# Patient Record
Sex: Female | Born: 1965 | Race: White | Hispanic: No | Marital: Single | Smoking: Current every day smoker
Health system: Southern US, Community
[De-identification: ages and names within clinical notes are randomized; demographics above are authoritative.]

## PROBLEM LIST (undated history)

## (undated) DIAGNOSIS — G459 Transient cerebral ischemic attack, unspecified: Secondary | ICD-10-CM

## (undated) DIAGNOSIS — F32A Depression, unspecified: Secondary | ICD-10-CM

## (undated) DIAGNOSIS — E079 Disorder of thyroid, unspecified: Secondary | ICD-10-CM

## (undated) DIAGNOSIS — H409 Unspecified glaucoma: Secondary | ICD-10-CM

## (undated) DIAGNOSIS — R87619 Unspecified abnormal cytological findings in specimens from cervix uteri: Secondary | ICD-10-CM

## (undated) DIAGNOSIS — F17200 Nicotine dependence, unspecified, uncomplicated: Principal | ICD-10-CM

## (undated) DIAGNOSIS — IMO0002 Reserved for concepts with insufficient information to code with codable children: Secondary | ICD-10-CM

## (undated) DIAGNOSIS — T7840XA Allergy, unspecified, initial encounter: Secondary | ICD-10-CM

## (undated) DIAGNOSIS — Z8711 Personal history of peptic ulcer disease: Secondary | ICD-10-CM

## (undated) DIAGNOSIS — M25531 Pain in right wrist: Secondary | ICD-10-CM

## (undated) DIAGNOSIS — G4730 Sleep apnea, unspecified: Secondary | ICD-10-CM

## (undated) DIAGNOSIS — Z8719 Personal history of other diseases of the digestive system: Secondary | ICD-10-CM

## (undated) DIAGNOSIS — F419 Anxiety disorder, unspecified: Secondary | ICD-10-CM

## (undated) DIAGNOSIS — G8929 Other chronic pain: Secondary | ICD-10-CM

## (undated) DIAGNOSIS — E785 Hyperlipidemia, unspecified: Secondary | ICD-10-CM

## (undated) DIAGNOSIS — F329 Major depressive disorder, single episode, unspecified: Secondary | ICD-10-CM

## (undated) DIAGNOSIS — R569 Unspecified convulsions: Secondary | ICD-10-CM

## (undated) HISTORY — PX: OOPHORECTOMY: SHX86

## (undated) HISTORY — PX: UTERINE FIBROID SURGERY: SHX826

## (undated) HISTORY — PX: OTHER SURGICAL HISTORY: SHX169

## (undated) HISTORY — DX: Unspecified glaucoma: H40.9

## (undated) HISTORY — DX: Transient cerebral ischemic attack, unspecified: G45.9

## (undated) HISTORY — DX: Reserved for concepts with insufficient information to code with codable children: IMO0002

## (undated) HISTORY — DX: Hyperlipidemia, unspecified: E78.5

## (undated) HISTORY — DX: Nicotine dependence, unspecified, uncomplicated: F17.200

## (undated) HISTORY — DX: Allergy, unspecified, initial encounter: T78.40XA

## (undated) HISTORY — PX: EYE SURGERY: SHX253

## (undated) HISTORY — PX: BREAST SURGERY: SHX581

## (undated) HISTORY — PX: GUM SURGERY: SHX658

## (undated) HISTORY — DX: Personal history of other diseases of the digestive system: Z87.19

## (undated) HISTORY — DX: Unspecified convulsions: R56.9

## (undated) HISTORY — DX: Anxiety disorder, unspecified: F41.9

## (undated) HISTORY — PX: CHOLECYSTECTOMY: SHX55

## (undated) HISTORY — DX: Personal history of peptic ulcer disease: Z87.11

## (undated) HISTORY — DX: Depression, unspecified: F32.A

## (undated) HISTORY — PX: TUBAL LIGATION: SHX77

## (undated) HISTORY — DX: Major depressive disorder, single episode, unspecified: F32.9

## (undated) HISTORY — DX: Unspecified abnormal cytological findings in specimens from cervix uteri: R87.619

## (undated) HISTORY — DX: Disorder of thyroid, unspecified: E07.9

---
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
## 1993-10-08 HISTORY — PX: CHOLECYSTECTOMY: SHX55

## 2001-10-08 HISTORY — PX: ANKLE SURGERY: SHX546

## 2007-09-19 ENCOUNTER — Ambulatory Visit (HOSPITAL_COMMUNITY): Admission: RE | Admit: 2007-09-19 | Discharge: 2007-09-19 | Payer: Self-pay | Admitting: Family Medicine

## 2007-10-29 ENCOUNTER — Encounter (INDEPENDENT_AMBULATORY_CARE_PROVIDER_SITE_OTHER): Payer: Self-pay | Admitting: Obstetrics

## 2007-10-29 ENCOUNTER — Ambulatory Visit (HOSPITAL_COMMUNITY): Admission: RE | Admit: 2007-10-29 | Discharge: 2007-10-29 | Payer: Self-pay | Admitting: Obstetrics

## 2008-06-07 ENCOUNTER — Ambulatory Visit (HOSPITAL_COMMUNITY): Admission: RE | Admit: 2008-06-07 | Discharge: 2008-06-07 | Payer: Self-pay | Admitting: Internal Medicine

## 2008-08-24 ENCOUNTER — Emergency Department (HOSPITAL_COMMUNITY): Admission: EM | Admit: 2008-08-24 | Discharge: 2008-08-24 | Payer: Self-pay | Admitting: Emergency Medicine

## 2008-10-26 ENCOUNTER — Other Ambulatory Visit (HOSPITAL_COMMUNITY): Admission: RE | Admit: 2008-10-26 | Discharge: 2008-11-10 | Payer: Self-pay | Admitting: Psychiatry

## 2008-10-29 ENCOUNTER — Ambulatory Visit: Payer: Self-pay | Admitting: Psychiatry

## 2008-11-02 ENCOUNTER — Emergency Department (HOSPITAL_COMMUNITY): Admission: EM | Admit: 2008-11-02 | Discharge: 2008-11-02 | Payer: Self-pay | Admitting: Emergency Medicine

## 2008-11-30 ENCOUNTER — Ambulatory Visit (HOSPITAL_COMMUNITY): Admission: RE | Admit: 2008-11-30 | Discharge: 2008-11-30 | Payer: Self-pay | Admitting: Neurology

## 2010-10-29 ENCOUNTER — Encounter: Payer: Self-pay | Admitting: Internal Medicine

## 2011-02-20 NOTE — Op Note (Signed)
Theresa Castillo NO.:  [id number]   MEDICAL RECORD NO.:  [id number]          PATIENT TYPE:  AMB   LOCATION:  [facility]                           FACILITY:  [facility]   PHYSICIAN:  Lendon Colonel, MD   DATE OF BIRTH:  06/27/1966   DATE OF PROCEDURE:  10/29/2007  DATE OF DISCHARGE:                               OPERATIVE REPORT   PREOPERATIVE DIAGNOSIS:  Dysfunctional uterine bleeding.   POSTOPERATIVE DIAGNOSIS:  Submucosal fibroid.   PROCEDURE:  Hysteroscopy, resectoscopic myomectomy.   FINDINGS:  Two submucosal fibroids, one 3 x 2 cm, one 1 x 0.5 cm.  200  mL hysteroscopic deficit.  Bilateral ostia seen.  Cystocele and  rectocele are present.   SURGEON:  Lendon Colonel, M.D.   ASSISTANT:  None.   ANESTHESIA:  General.   FINDINGS:  Endometrial curettings, fibroids.   DISPOSITION:  Specimens to pathology.   ESTIMATED BLOOD LOSS:  Minimal.   COMPLICATIONS:  None.   DESCRIPTION OF PROCEDURE:  After informed consent was obtained, the  patient was taken to the operating room where general anesthesia was  administered without difficulty.  She was prepped and draped in the  usual sterile fashion in the dorsal supine lithotomy position.  A  straight catheter was performed for about 200 mL of clear urine.  A  bimanual examination was performed to assess the size and position of  the uterus.  An anteverted 7-8 cm uterus was palpated and no adnexal  masses were noted.  Examination was suboptimal given the patient's body  habitus.  A sterile speculum was inserted into the vagina.  A single  tooth tenaculum used to grasp the anterior lip of the cervix.  Attempt  at placing the small hysteroscope was unsuccessful.  The cervix was then  dilated to a #19 Pratt dilator.  A Hegar dilator was also used to  identify the length of the cervical canal.  Cervical canal length noted  to be 3.5 cm.  Uterine sound sounded to 9 cm giving a 5.5 cm cavity  length. These  measurements were noted for the planned NovaSure.  The  small hysteroscope was inserted into the uterine cavity.  Bilateral  ostia were seen.  A 3 x 2 cm submucosal fibroid was noted attached at  the left posterior aspect of the uterus.  A smaller 1 x 0.5 cm  submucosal fibroid was noted on the left lateral wall of the uterus.  After careful inspection, decision was made to proceed with  resectoscopic myomectomy rather than NovaSure as was planned given the  size of the submucosal fibroid.  The camera was removed.  The uterus was  dilated to a #27 Shawnie Pons and LR was changed to Sorbitol for fluid.  The  resectoscope was then inserted into the uterine cavity and the uterine  cavity was flushed of its Ringer's lactate.  The right angle loop was  used w/ cutting current to remove the fibroids. Several passes were made  to remove both submucosal fibroids.  The resectoscope was removed  several times to remove chips and improve visualization during the  procedure.  The camera had to be changed one time due to suboptimal  visualization and difficulty with the camera.  All passes were made  under good visualization and endometrial curettage was carried out to  remove leftover parts of the fibroid and to complete a curettage.  Once  the fibroid was removed in its entirety, the resectoscope was removed.  The deficit was noted to be 200 mL of Sorbitol.  The tenaculum site was  hemostatic.  All instruments were removed.  The specimen was sent to  pathology and the patient was taken to the recovery room in stable  condition.      Lendon Colonel, MD  Electronically Signed     KAF/MEDQ  D:  10/29/2007  T:  10/29/2007  Job:  [id number]

## 2011-05-29 ENCOUNTER — Other Ambulatory Visit: Payer: Self-pay | Admitting: Family Medicine

## 2011-05-29 DIAGNOSIS — Z1231 Encounter for screening mammogram for malignant neoplasm of breast: Secondary | ICD-10-CM

## 2011-06-14 ENCOUNTER — Ambulatory Visit: Payer: Self-pay

## 2011-06-15 ENCOUNTER — Ambulatory Visit (AMBULATORY_SURGERY_CENTER): Payer: PRIVATE HEALTH INSURANCE | Admitting: *Deleted

## 2011-06-15 VITALS — Ht 67.0 in | Wt 255.0 lb

## 2011-06-15 DIAGNOSIS — Z1211 Encounter for screening for malignant neoplasm of colon: Secondary | ICD-10-CM

## 2011-06-15 MED ORDER — SUPREP BOWEL PREP KIT 17.5-3.13-1.6 GM/177ML PO SOLN: 1.0000 | ORAL | Status: DC

## 2011-06-18 ENCOUNTER — Ambulatory Visit
Admission: RE | Admit: 2011-06-18 | Discharge: 2011-06-18 | Disposition: A | Discharge status: Home / Self Care | Payer: PRIVATE HEALTH INSURANCE | Source: Ambulatory Visit | Attending: Family Medicine | Admitting: Family Medicine

## 2011-06-18 ENCOUNTER — Encounter: Payer: Self-pay | Admitting: Gastroenterology

## 2011-06-18 DIAGNOSIS — Z1231 Encounter for screening mammogram for malignant neoplasm of breast: Secondary | ICD-10-CM

## 2011-06-20 ENCOUNTER — Other Ambulatory Visit: Payer: Self-pay | Admitting: Gastroenterology

## 2011-06-25 ENCOUNTER — Encounter: Payer: Self-pay | Admitting: Gastroenterology

## 2011-06-25 ENCOUNTER — Ambulatory Visit (AMBULATORY_SURGERY_CENTER): Payer: PRIVATE HEALTH INSURANCE | Admitting: Gastroenterology

## 2011-06-25 DIAGNOSIS — D126 Benign neoplasm of colon, unspecified: Secondary | ICD-10-CM

## 2011-06-25 DIAGNOSIS — Z800 Family history of malignant neoplasm of digestive organs: Secondary | ICD-10-CM

## 2011-06-25 DIAGNOSIS — Z1211 Encounter for screening for malignant neoplasm of colon: Secondary | ICD-10-CM

## 2011-06-25 MED ORDER — SODIUM CHLORIDE 0.9 % IV SOLN: 500.0000 mL | INTRAVENOUS | Status: DC

## 2011-06-25 NOTE — Patient Instructions (Signed)

## 2011-06-26 ENCOUNTER — Telehealth: Payer: Self-pay

## 2011-06-26 NOTE — Telephone Encounter (Signed)

## 2011-06-28 LAB — CBC
Hemoglobin: 13.9
MCHC: 33.8
MCV: 90.6
RDW: 14.1
WBC: 14.2 — ABNORMAL HIGH

## 2011-06-28 LAB — BASIC METABOLIC PANEL
BUN: 9
Calcium: 9.2
Chloride: 100
GFR calc Af Amer: >60
GFR calc non Af Amer: >60

## 2011-06-28 LAB — TYPE AND SCREEN
ABO/RH(D): O POS
Antibody Screen: NEGATIVE

## 2011-07-03 ENCOUNTER — Other Ambulatory Visit: Payer: Self-pay | Admitting: Family Medicine

## 2011-07-03 DIAGNOSIS — R928 Other abnormal and inconclusive findings on diagnostic imaging of breast: Secondary | ICD-10-CM

## 2011-07-06 ENCOUNTER — Other Ambulatory Visit: Payer: Self-pay | Admitting: Gastroenterology

## 2011-07-10 LAB — BASIC METABOLIC PANEL
CO2: 28
Chloride: 106
Creatinine, Ser: 0.58
GFR calc Af Amer: >60

## 2011-07-10 LAB — DIFFERENTIAL
Basophils Relative: 1
Eosinophils Absolute: 0.3
Monocytes Absolute: 0.5
Monocytes Relative: 3
Neutrophils Relative %: 75

## 2011-07-10 LAB — CBC
MCHC: 34
MCV: 93.9
RBC: 4.47

## 2011-07-13 ENCOUNTER — Ambulatory Visit
Admission: RE | Admit: 2011-07-13 | Discharge: 2011-07-13 | Disposition: A | Discharge status: Home / Self Care | Payer: PRIVATE HEALTH INSURANCE | Source: Ambulatory Visit | Attending: Family Medicine | Admitting: Family Medicine

## 2011-07-13 ENCOUNTER — Other Ambulatory Visit: Payer: Self-pay | Admitting: Family Medicine

## 2011-07-13 DIAGNOSIS — R928 Other abnormal and inconclusive findings on diagnostic imaging of breast: Secondary | ICD-10-CM

## 2011-07-30 ENCOUNTER — Ambulatory Visit (INDEPENDENT_AMBULATORY_CARE_PROVIDER_SITE_OTHER): Payer: PRIVATE HEALTH INSURANCE | Admitting: General Surgery

## 2011-07-30 ENCOUNTER — Encounter (INDEPENDENT_AMBULATORY_CARE_PROVIDER_SITE_OTHER): Payer: Self-pay | Admitting: General Surgery

## 2011-07-30 ENCOUNTER — Other Ambulatory Visit (INDEPENDENT_AMBULATORY_CARE_PROVIDER_SITE_OTHER): Payer: Self-pay | Admitting: General Surgery

## 2011-07-30 VITALS — BP 116/88 | HR 84 | Temp 97.2°F | Resp 20 | Ht 67.0 in | Wt 258.2 lb

## 2011-07-30 DIAGNOSIS — R920 Mammographic microcalcification found on diagnostic imaging of breast: Secondary | ICD-10-CM

## 2011-07-30 DIAGNOSIS — D369 Benign neoplasm, unspecified site: Secondary | ICD-10-CM

## 2011-07-30 DIAGNOSIS — D249 Benign neoplasm of unspecified breast: Secondary | ICD-10-CM

## 2011-07-30 NOTE — Progress Notes (Signed)
Chief Complaint  Patient presents with  . Other    new pt- eval papilloma of lt breast    HPI Theresa Castillo is a 45 y.o. female.    This is a 45 year old Caucasian female, referred through  the courtesy of Dr. 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 Her primary care physician is [facility].  The patient used to live in the  [place] area. She has moved here with her husband. She states that she's had a left nipple discharge for 10 years. She says this is spontaneous. It has never been bloody. No prior breast problems. She presented for imaging studies recently.  they found several calcifications within a left breast duct extending to almost 5 cm deep to the nipple. They also found a dilated duct at the 9:00 position of the left breast. Ductogram showed a filling defect 3.8 cm deep to the nipple and extending into several branches of the duct. They did not perform any biopsy. She was sent for surgical excision.  Family history is significant for cancer in the mother at age 51 who had chemotherapy. Also one maternal aunt and 2 paternal aunts had breast cancer. There is no family history of ovarian cancer.  HPI  Past Medical History  Diagnosis Date  . Allergy     pineapple abd pain and diarrhea  . Anxiety   . Depression   . Thyroid disease     borderline hypo  . Ulcer     born with stomach ulcer  . Hyperlipidemia   . Seizures     in result of topamax    Past Surgical History  Procedure Date  . Cholecystectomy 1995  . Salpingoophorectomy 1984    right  . Ankle surgery 2003    right  . Pilonial cyst removed twice   . Tubal ligation   . Oophorectomy   . Uterine fibroid surgery     Family History  Problem Relation Age of Onset  . Colon cancer Father   . Cancer Mother     breast  . Cancer Maternal Aunt     breast and thyroid  . Cancer Paternal Aunt     breast  . Cancer Maternal  Grandmother     colon    Social History History  Substance Use Topics  . Smoking status: Current Everyday Smoker -- 1.5 packs/day    Types: Cigarettes  . Smokeless tobacco: Never Used  . Alcohol Use: No     rarely    Allergies  Allergen Reactions  . Ammonia Swelling    Throat swells shut  . Amoxicillin Other (See Comments)    Passing out  . Topamax     Caused seizures     Current Outpatient Prescriptions  Medication Sig Dispense Refill  . ALPRAZolam (XANAX) 0.5 MG tablet Take 0.5 mg by mouth as needed. anxiety      . Bioflavonoid Products (GRAPE SEED PO) Take by mouth 2 (two) times daily.        Marland Kitchen CRANBERRY PO Take 4,200 mg by mouth daily.        . folic acid (FOLVITE) 1 MG tablet Take 2 mg by mouth daily.        . Lactobacillus (ACIDOPHILUS PO) Take by mouth daily.        Marland Kitchen levothyroxine (SYNTHROID, LEVOTHROID) 25 MCG tablet Take 25 mcg by mouth daily.        . Multiple  Vitamin (MULTIVITAMIN PO) Take 1 tablet by mouth daily.        Marland Kitchen OLANZapine (ZYPREXA) 5 MG tablet Take 5 mg by mouth every morning.        . Omega-3 Fatty Acids (FISH OIL PO) Take 2,400 mg by mouth daily.        Marland Kitchen venlafaxine (EFFEXOR-XR) 150 MG 24 hr capsule Take 150 mg by mouth 2 (two) times daily.        Marland Kitchen zolpidem (AMBIEN) 10 MG tablet Take 10 mg by mouth at bedtime as needed.          Review of Systems Review of Systems  Constitutional: Negative for fever, chills and unexpected weight change.  HENT: Negative for hearing loss, congestion, sore throat, trouble swallowing and voice change.   Eyes: Negative for visual disturbance.  Respiratory: Positive for cough. Negative for wheezing.   Cardiovascular: Negative for chest pain, palpitations and leg swelling.  Gastrointestinal: Negative for nausea, vomiting, abdominal pain, diarrhea, constipation, blood in stool, abdominal distention and anal bleeding.  Genitourinary: Negative for hematuria, vaginal bleeding and difficulty urinating.    Musculoskeletal: Negative for arthralgias.  Skin: Negative for rash and wound.  Neurological: Positive for seizures. Negative for syncope and headaches.  Hematological: Negative for adenopathy. Does not bruise/bleed easily.  Psychiatric/Behavioral: Negative for confusion.    Blood pressure 116/88, pulse 84, temperature 97.2 F (36.2 C), temperature source Temporal, resp. rate 20, height 5\' 7"  (1.702 m), weight 258 lb 3.2 oz (117.119 kg).  Physical Exam Physical Exam  Constitutional: She is oriented to person, place, and time. She appears well-developed and well-nourished. No distress.  HENT:  Head: Normocephalic and atraumatic.  Nose: Nose normal.  Mouth/Throat: No oropharyngeal exudate.  Eyes: Conjunctivae and EOM are normal. Pupils are equal, round, and reactive to light. Left eye exhibits no discharge. No scleral icterus.  Neck: Neck supple. No JVD present. No tracheal deviation present. No thyromegaly present.  Cardiovascular: Normal rate, regular rhythm, normal heart sounds and intact distal pulses.   No murmur heard. Pulmonary/Chest: Effort normal and breath sounds normal. No respiratory distress. She has no wheezes. She has no rales. She exhibits no tenderness.    Abdominal: Soft. Bowel sounds are normal. She exhibits no distension and no mass. There is no tenderness. There is no rebound and no guarding.       laparoscopy scars. pfannensteil scar.  Musculoskeletal: She exhibits no edema and no tenderness.  Lymphadenopathy:    She has no cervical adenopathy.  Neurological: She is alert and oriented to person, place, and time. She exhibits normal muscle tone. Coordination normal.  Skin: Skin is warm. No rash noted. She is not diaphoretic. No erythema. No pallor.  Psychiatric: She has a normal mood and affect. Her behavior is normal. Judgment and thought content normal.    Data Reviewed I reviewed the mammograms, the ductogram, and some of her old records  online.   Assessment    Non-bloody discharge left nipple, associated with calcifications and a filling defect on ductogram. 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 She will need this area excised to rule out occult carcinoma in situ.  Strong family history of breast cancer in first and second line relatives.  Anxiety and depression  Seizure disorder  History laparoscopic cholecystectomy  History of right salpingo-oophorectomy for benign disease.    Plan    Schedule for excisional biopsy. I need to to use lacrimal duct probes to attempt to isolate this duct, and am also going to ask radiology  to do a needle localization and specimen mammogram so I can be sure of the orientation and extent of calcifications to be removed.  I discussed the indications and details of surgery with her. Risks and complications have been outlined, including not limited to bleeding, infection, cosmetic deformity, reoperation for cancer, nerve damage chronic pain or numbness, skin necrosis, abnormal nipple sensation, and other proceed problems. She is understanding the issues well. The tunnel for questions are answered. She is in full agreement with this plan.       Mcgregor Tinnon M 07/30/2011, 3:48 PM

## 2011-07-30 NOTE — Patient Instructions (Signed)
You have an abnormal discharge from your left breast nipple. There is a dilated duct, calcifications, and a filling defect. We will need to remove this entire area, and we will schedule you for this surgery. We will ask the radiologist to put a wire and this to be sure that we can target the complete extent of the calcifications. We will proceed with scheduling of the surgery in the near future.  Breast Biopsy WHY YOU NEED A BIOPSY Your caregiver has recommended that you have a breast tissue sample taken (biopsy). This is done to be certain that the lump or abnormality found in your breast is not cancerous (malignant). During a biopsy, a small piece of tissue is removed, so it can be examined under a microscope by a specialist (pathologist) who looks at tissues and cells and diagnoses abnormalities in them. Most lumps (tumors) or abnormalities, on or in the breast, are not cancerous (benign). However, biopsies are taken when your caregiver cannot be absolutely certain of what is wrong only from doing a physical exam, mammogram (breast X-ray), or other studies. A breast biopsy can tell you whether nothing more needs to be done, or you need more surgery or another type of treatment. A biopsy is done when there is:  Any undiagnosed breast mass.   Nipple abnormalities, dimpling, crusting, or ulcerations.   Calcium deposits (calcifications) or abnormalities seen on your mammogram, ultrasound, or MRI.   Suspicious changes in the breast (thickening, asymmetry) seen on mammogram.   Abnormal discharge from the nipple, especially blood.   Redness, swelling, and pain of the breast.  HOW A BIOPSY IS PERFORMED A biopsy is often performed on an outpatient basis (you go home the same day). This can be done in a hospital, clinic, or surgical center. Tissue samples (biopsies) are often done under local anesthesia (area is numbed). Sometimes general anesthetics are required, in which case you sleep through the  procedure. Biopsies may remove the entire lump, a small piece of the lump, or a small sliver of tissue removed by needle. TYPES OF BREAST BIOPSY  Fine needle aspiration. A thin needle is placed through the skin, to the lump or cyst, and cells are removed.   Core needle biopsy. A large needle with a special tip is placed through the skin, to the abnormality, and a piece of tissue is removed.   Stereotactic biopsy. A core needle with a special X-ray is used, to direct the needle to the lump or abnormal area, which is difficult to feel or cannot be felt.   Vacuum-assisted biopsy. A hollow probe and a gentle vacuum remove a sample of tissue.   Ultrasound guided core needle biopsy. You lie on your stomach, with your breast through an opening, and a high frequency ultrasound helps guide the needle to the area of the abnormality.   Open biopsy. An incision is made in the breast, and a piece of the lump or the whole lump is removed.  LET YOUR CAREGIVER KNOW ABOUT:  Allergies.   Medicines taken, including herbs, eye drops, over-the-counter medicines, and creams.   Use of steroids (by mouth or creams).   Previous problems with anesthetics or Novocaine.   If you are taking aspirin or blood thinners.   Possibility of pregnancy, if this applies.   History of blood clots (thrombophlebitis).   History of bleeding or blood problems.   Previous surgery.   Other health problems.  RISKS AND COMPLICATIONS   Bleeding.   Infection.   Allergy  to medicines.   Bruising and swelling of the breast.   Alteration in the shape of the breast.   Not finding the lump or abnormality.   Needing more surgery.  BEFORE THE PROCEDURE  You should arrive 60 minutes prior to your procedure or as directed.   Check-in at the admissions desk, to fill out necessary forms, if you are not preregistered.   There will be consent forms to sign, prior to the procedure.   There is a waiting area for your  family, while you are having your biopsy.   Try to have someone with you, to drive you home.   Do not smoke for 2 weeks before the surgery.   Let your caregiver know if you develop a cold or an infection.   Do not drink alcohol for at least 24 hours before surgery.   Wear a good support bra to the surgery.  AFTER THE PROCEDURE  After surgery, you will be taken to the recovery area, where a nurse will watch and check your progress. Once you are awake, stable, and taking fluids well, if there are no other problems, you will be allowed to go home.   Ice packs applied to your operative site may help with discomfort and keep the swelling down.   You may resume normal diet and activities as directed. Avoid strenuous activities affecting the arm on the side of the biopsy, such as tennis, swimming, heavy lifting (more than 10 pounds) or pulling.   Bruising in the breast is normal following this procedure.   Wearing a support bra, even to bed, may be more comfortable. The bra will also help keep the dressing on.   Change dressings as directed.   Your doctor may apply a pressure dressing on your breast for 24 to 48 hours.   Only take over-the-counter or prescription medicines for pain, discomfort, or fever as directed by your caregiver.   Do not take aspirin, because it can cause bleeding.  HOME CARE INSTRUCTIONS   You may resume your usual diet.   Have someone drive you home after the surgery.   Do not do any exercise, driving, lifting or general activities without your caregiver's permission.   Take medicines and over-the-counter medicines, as ordered by your caregiver.   Keep your postoperative appointments as recommended.   Do not drink alcohol while taking pain medicine.  Finding out the results of your test Not all test results are available during your visit. If your test results are not back during the visit, make an appointment with your caregiver to find out the results.  Do not assume everything is normal if you have not heard from your caregiver or the medical facility. It is important for you to follow up on all of your test results.  SEEK MEDICAL CARE IF:   You notice redness, swelling, or increasing pain in the wound.   You notice a bad smell coming from the wound or dressing.   You develop a rash.   You need stronger pain medicine.   You are having an allergic reaction or problems with your medicines.  SEEK IMMEDIATE MEDICAL CARE IF:   You have difficulty breathing.   You have a fever.   There is increased bleeding (more than a small spot) from the wound.   Pus is coming from the wound.   The wound is breaking open.  Document Released: 09/24/2005 Document Revised: 06/06/2011 Document Reviewed: 08/12/2009 [facility] Patient Information 2012 [place], [place].

## 2011-08-15 ENCOUNTER — Encounter (HOSPITAL_COMMUNITY): Payer: Self-pay

## 2011-08-15 ENCOUNTER — Encounter (HOSPITAL_COMMUNITY): Payer: Self-pay | Admitting: Pharmacy Technician

## 2011-08-15 ENCOUNTER — Encounter (HOSPITAL_COMMUNITY)
Admission: RE | Admit: 2011-08-15 | Discharge: 2011-08-15 | Disposition: A | Discharge status: Home / Self Care | Payer: PRIVATE HEALTH INSURANCE | Source: Ambulatory Visit | Attending: General Surgery | Admitting: General Surgery

## 2011-08-15 LAB — URINALYSIS, ROUTINE W REFLEX MICROSCOPIC
Bilirubin Urine: NEGATIVE
Glucose, UA: NEGATIVE mg/dL
Ketones, ur: NEGATIVE mg/dL
Nitrite: NEGATIVE
Specific Gravity, Urine: 1.012 (ref 1.005–1.030)
pH: 7 (ref 5.0–8.0)

## 2011-08-15 LAB — COMPREHENSIVE METABOLIC PANEL
ALT: 17 U/L (ref 0–35)
AST: 20 U/L (ref 0–37)
Albumin: 3.6 g/dL (ref 3.5–5.2)
Alkaline Phosphatase: 112 U/L (ref 39–117)
Chloride: 101 mEq/L (ref 96–112)
Potassium: 4.5 mEq/L (ref 3.5–5.1)
Sodium: 141 mEq/L (ref 135–145)
Total Bilirubin: 0.2 mg/dL — ABNORMAL LOW (ref 0.3–1.2)
Total Protein: 7.2 g/dL (ref 6.0–8.3)

## 2011-08-15 LAB — DIFFERENTIAL
Basophils Relative: 1 % (ref 0–1)
Eosinophils Absolute: 0.5 10*3/uL (ref 0.0–0.7)
Eosinophils Relative: 3 % (ref 0–5)
Monocytes Absolute: 0.6 10*3/uL (ref 0.1–1.0)
Neutro Abs: 9.8 10*3/uL — ABNORMAL HIGH (ref 1.7–7.7)
Smear Review: ADEQUATE

## 2011-08-15 LAB — URINE MICROSCOPIC-ADD ON

## 2011-08-15 LAB — CBC
HCT: 44.1 % (ref 36.0–46.0)
MCHC: 33.6 g/dL (ref 30.0–36.0)
Platelets: 273 10*3/uL (ref 150–400)
RDW: 14.7 % (ref 11.5–15.5)
WBC: 15 10*3/uL — ABNORMAL HIGH (ref 4.0–10.5)

## 2011-08-15 LAB — SURGICAL PCR SCREEN: Staphylococcus aureus: NEGATIVE

## 2011-08-15 NOTE — Pre-Procedure Instructions (Signed)
20 Skylarr Liz  08/15/2011   Your procedure is scheduled on:  08/22/11  Report to [facility] at 730 AM.  Call this number if you have problems the morning of surgery: [phone]   Remember:   Do not eat food:After Midnight.  Do not drink clear liquids: 4 Hours before arrival.  Take these medicines the morning of surgery with A SIP OF WATER: xanax,synthroid   Do not wear jewelry, make-up or nail polish.  Do not wear lotions, powders, or perfumes. You may wear deodorant.  Do not shave 48 hours prior to surgery.  Do not bring valuables to the hospital.  Contacts, dentures or bridgework may not be worn into surgery.  Leave suitcase in the car. After surgery it may be brought to your room.  For patients admitted to the hospital, checkout time is 11:00 AM the day of discharge.   Patients discharged the day of surgery will not be allowed to drive home.  Name and phone number of your driver: *family**  Special Instructions: CHG Shower Use Special Wash: 1/2 bottle night before surgery and 1/2 bottle morning of surgery.   Please read over the following fact sheets that you were given: MRSA Information and Surgical Site Infection Prevention

## 2011-08-21 ENCOUNTER — Other Ambulatory Visit (INDEPENDENT_AMBULATORY_CARE_PROVIDER_SITE_OTHER): Payer: Self-pay | Admitting: General Surgery

## 2011-08-21 NOTE — H&P (Signed)
 Theresa Castillo   07/30/2011 2:45 PM Office Visit  MRN: [id number]   Description: 45 year old female  Provider: Edynn Gillock M, MD  Department: [facility]        Diagnoses     Intraductal papilloma of breast   - Primary    217      Reason for Visit     Other    new pt- eval papilloma of lt breast        Vitals - Last Recorded       BP Pulse Temp(Src) Resp Ht Wt    116/88  84  97.2 F (36.2 C) (Temporal)  20  5' 7" (1.702 m)  258 lb 3.2 oz (117.119 kg)          BMI              40.44 kg/m2                 Progress Notes     Bakari Nikolai M, MD  07/30/2011  3:58 PM  SignedChief Complaint   Patient presents with   .  Other       new pt- eval papilloma of lt breast      HPI Theresa Castillo is a 45 y.o. female.     This is a 45-year-old Caucasian female, referred through  the courtesy of Dr. Eagle for evaluation of a left breast nipple discharge, calcifications, and a suspected intraductal papilloma. Her primary care physician is [facility].   The patient used to live in the  [place] area. She has moved here with her husband. She states that she's had a left nipple discharge for 10 years. She says this is spontaneous. It has never been bloody. No prior breast problems. She presented for imaging studies recently.  they found several calcifications within a left breast duct extending to almost 5 cm deep to the nipple. They also found a dilated duct at the 9:00 position of the left breast. Ductogram showed a filling defect 3.8 cm deep to the nipple and extending into several branches of the duct. They did not perform any biopsy. She was sent for surgical excision.   Family history is significant for cancer in the mother at age 53 who had chemotherapy. Also one maternal aunt and 2 paternal aunts had breast cancer. There is no family history of ovarian cancer.   HPI    Past Medical History   Diagnosis  Date   .  Allergy      pineapple abd pain and diarrhea   .  Anxiety     .  Depression     .  Thyroid disease         borderline hypo   .  Ulcer         born with stomach ulcer   .  Hyperlipidemia     .  Seizures         in result of topamax       Past Surgical History   Procedure  Date   .  Cholecystectomy  1995   .  Salpingoophorectomy  1984       right   .  Ankle surgery  2003       right   .  Pilonial cyst removed twice     .  Tubal ligation     .  Oophorectomy     .  Uterine fibroid surgery           Family History   Problem  Relation  Age of Onset   .  Colon cancer  Father     .  Cancer  Mother         breast   .  Cancer  Maternal Aunt         breast and thyroid   .  Cancer  Paternal Aunt         breast   .  Cancer  Maternal Grandmother         colon      Social History History   Substance Use Topics   .  Smoking status:  Current Everyday Smoker -- 1.5 packs/day       Types:  Cigarettes   .  Smokeless tobacco:  Never Used   .  Alcohol Use:  No         rarely       Allergies   Allergen  Reactions   .  Ammonia  Swelling       Throat swells shut   .  Amoxicillin  Other (See Comments)       Passing out   .  Topamax         Caused seizures         Current Outpatient Prescriptions   Medication  Sig  Dispense  Refill   .  ALPRAZolam (XANAX) 0.5 MG tablet  Take 0.5 mg by mouth as needed. anxiety         .  Bioflavonoid Products (GRAPE SEED PO)  Take by mouth 2 (two) times daily.           .  CRANBERRY PO  Take 4,200 mg by mouth daily.           .  folic acid (FOLVITE) 1 MG tablet  Take 2 mg by mouth daily.           .  Lactobacillus (ACIDOPHILUS PO)  Take by mouth daily.           .  levothyroxine (SYNTHROID, LEVOTHROID) 25 MCG tablet  Take 25 mcg by mouth daily.           .  Multiple Vitamin (MULTIVITAMIN PO)  Take 1 tablet by mouth daily.           .  OLANZapine (ZYPREXA) 5 MG tablet  Take 5 mg by mouth every morning.          .  Omega-3 Fatty Acids (FISH OIL PO)  Take  2,400 mg by mouth daily.           .  venlafaxine (EFFEXOR-XR) 150 MG 24 hr capsule  Take 150 mg by mouth 2 (two) times daily.           .  zolpidem (AMBIEN) 10 MG tablet  Take 10 mg by mouth at bedtime as needed.              Review of Systems Review of Systems  Constitutional: Negative for fever, chills and unexpected weight change.  HENT: Negative for hearing loss, congestion, sore throat, trouble swallowing and voice change.   Eyes: Negative for visual disturbance.  Respiratory: Positive for cough. Negative for wheezing.   Cardiovascular: Negative for chest pain, palpitations and leg swelling.  Gastrointestinal: Negative for nausea, vomiting, abdominal pain, diarrhea, constipation, blood in stool, abdominal distention and anal bleeding.  Genitourinary: Negative for hematuria, vaginal bleeding and difficulty urinating.  Musculoskeletal: Negative for arthralgias.  Skin: Negative for rash and   wound.  Neurological: Positive for seizures. Negative for syncope and headaches.  Hematological: Negative for adenopathy. Does not bruise/bleed easily.  Psychiatric/Behavioral: Negative for confusion.    Blood pressure 116/88, pulse 84, temperature 97.2 F (36.2 C), temperature source Temporal, resp. rate 20, height 5' 7" (1.702 m), weight 258 lb 3.2 oz (117.119 kg).   Physical Exam Physical Exam  Constitutional: She is oriented to person, place, and time. She appears well-developed and well-nourished. No distress.  HENT:   Head: Normocephalic and atraumatic.   Nose: Nose normal.   Mouth/Throat: No oropharyngeal exudate.  Eyes: Conjunctivae and EOM are normal. Pupils are equal, round, and reactive to light. Left eye exhibits no discharge. No scleral icterus.  Neck: Neck supple. No JVD present. No tracheal deviation present. No thyromegaly present.  Cardiovascular: Normal rate, regular rhythm, normal heart sounds and intact distal pulses.    No murmur heard. Pulmonary/Chest: Effort normal  and breath sounds normal. No respiratory distress. She has no wheezes. She has no rales. She exhibits no tenderness.    Abdominal: Soft. Bowel sounds are normal. She exhibits no distension and no mass. There is no tenderness. There is no rebound and no guarding.       laparoscopy scars. pfannensteil scar.  Musculoskeletal: She exhibits no edema and no tenderness.  Lymphadenopathy:    She has no cervical adenopathy.  Neurological: She is alert and oriented to person, place, and time. She exhibits normal muscle tone. Coordination normal.  Skin: Skin is warm. No rash noted. She is not diaphoretic. No erythema. No pallor.  Psychiatric: She has a normal mood and affect. Her behavior is normal. Judgment and thought content normal.    Data Reviewed I reviewed the mammograms, the ductogram, and some of her old records online.               Assessment Non-bloody discharge left nipple, associated with calcifications and a filling defect on ductogram. 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 She will need this area excised to rule out occult carcinoma in situ.   Strong family history of breast cancer in first and second line relatives.   Anxiety and depression   Seizure disorder   History laparoscopic cholecystectomy   History of right salpingo-oophorectomy for benign disease.   Plan Schedule for excisional biopsy. I need to to use lacrimal duct probes to attempt to isolate this duct, and am also going to ask radiology to do a needle localization and specimen mammogram so I can be sure of the orientation and extent of calcifications to be removed.   I discussed the indications and details of surgery with her. Risks and complications have been outlined, including not limited to bleeding, infection, cosmetic deformity, reoperation for cancer, nerve damage chronic pain or numbness, skin necrosis, abnormal nipple sensation, and other proceed problems. She is understanding the issues well. The  tunnel for questions are answered. She is in full agreement with this plan.       Evey Mcmahan M 07/30/2011, 3:48 PM                Not recorded       Discontinued Medications         Reason for Discontinue    OLANZapine (ZYPREXA) 10 MG tablet Error           Patient Instructions     You have an abnormal discharge from your left breast nipple. There is a dilated duct, calcifications, and a filling defect. We will need to remove this   entire area, and we will schedule you for this surgery. We will ask the radiologist to put a wire and this to be sure that we can target the complete extent of the calcifications. We will proceed with scheduling of the surgery in the near future.   Breast Biopsy WHY YOU NEED A BIOPSY  Your caregiver has recommended that you have a breast tissue sample taken (biopsy). This is done to be certain that the lump or abnormality found in your breast is not cancerous (malignant). During a biopsy, a small piece of tissue is removed, so it can be examined under a microscope by a specialist (pathologist) who looks at tissues and cells and diagnoses abnormalities in them. Most lumps (tumors) or abnormalities, on or in the breast, are not cancerous (benign). However, biopsies are taken when your caregiver cannot be absolutely certain of what is wrong only from doing a physical exam, mammogram (breast X-ray), or other studies. A breast biopsy can tell you whether nothing more needs to be done, or you need more surgery or another type of treatment. A biopsy is done when there is: Any undiagnosed breast mass.   Nipple abnormalities, dimpling, crusting, or ulcerations.   Calcium deposits (calcifications) or abnormalities seen on your mammogram, ultrasound, or MRI.   Suspicious changes in the breast (thickening, asymmetry) seen on mammogram.   Abnormal discharge from the nipple, especially blood.   Redness, swelling, and pain of the breast.  HOW A BIOPSY  IS PERFORMED A biopsy is often performed on an outpatient basis (you go home the same day). This can be done in a hospital, clinic, or surgical center. Tissue samples (biopsies) are often done under local anesthesia (area is numbed). Sometimes general anesthetics are required, in which case you sleep through the procedure. Biopsies may remove the entire lump, a small piece of the lump, or a small sliver of tissue removed by needle. TYPES OF BREAST BIOPSY Fine needle aspiration. A thin needle is placed through the skin, to the lump or cyst, and cells are removed.   Core needle biopsy. A large needle with a special tip is placed through the skin, to the abnormality, and a piece of tissue is removed.   Stereotactic biopsy. A core needle with a special X-ray is used, to direct the needle to the lump or abnormal area, which is difficult to feel or cannot be felt.   Vacuum-assisted biopsy. A hollow probe and a gentle vacuum remove a sample of tissue.   Ultrasound guided core needle biopsy. You lie on your stomach, with your breast through an opening, and a high frequency ultrasound helps guide the needle to the area of the abnormality.   Open biopsy. An incision is made in the breast, and a piece of the lump or the whole lump is removed.  LET YOUR CAREGIVER KNOW ABOUT: Allergies.   Medicines taken, including herbs, eye drops, over-the-counter medicines, and creams.   Use of steroids (by mouth or creams).   Previous problems with anesthetics or Novocaine.   If you are taking aspirin or blood thinners.   Possibility of pregnancy, if this applies.   History of blood clots (thrombophlebitis).   History of bleeding or blood problems.   Previous surgery.   Other health problems.  RISKS AND COMPLICATIONS   Bleeding.   Infection.   Allergy to medicines.   Bruising and swelling of the breast.   Alteration in the shape of the breast.   Not finding the lump or abnormality.     Needing more surgery.  BEFORE  THE PROCEDURE You should arrive 60 minutes prior to your procedure or as directed.   Check-in at the admissions desk, to fill out necessary forms, if you are not preregistered.   There will be consent forms to sign, prior to the procedure.   There is a waiting area for your family, while you are having your biopsy.   Try to have someone with you, to drive you home.   Do not smoke for 2 weeks before the surgery.   Let your caregiver know if you develop a cold or an infection.   Do not drink alcohol for at least 24 hours before surgery.   Wear a good support bra to the surgery.  AFTER THE PROCEDURE After surgery, you will be taken to the recovery area, where a nurse will watch and check your progress. Once you are awake, stable, and taking fluids well, if there are no other problems, you will be allowed to go home.   Ice packs applied to your operative site may help with discomfort and keep the swelling down.   You may resume normal diet and activities as directed. Avoid strenuous activities affecting the arm on the side of the biopsy, such as tennis, swimming, heavy lifting (more than 10 pounds) or pulling.   Bruising in the breast is normal following this procedure.   Wearing a support bra, even to bed, may be more comfortable. The bra will also help keep the dressing on.   Change dressings as directed.   Your doctor may apply a pressure dressing on your breast for 24 to 48 hours.   Only take over-the-counter or prescription medicines for pain, discomfort, or fever as directed by your caregiver.   Do not take aspirin, because it can cause bleeding.  HOME CARE INSTRUCTIONS   You may resume your usual diet.   Have someone drive you home after the surgery.   Do not do any exercise, driving, lifting or general activities without your caregiver's permission.   Take medicines and over-the-counter medicines, as ordered by your caregiver.   Keep your postoperative appointments as recommended.   Do  not drink alcohol while taking pain medicine.  Finding out the results of your test Not all test results are available during your visit. If your test results are not back during the visit, make an appointment with your caregiver to find out the results. Do not assume everything is normal if you have not heard from your caregiver or the medical facility. It is important for you to follow up on all of your test results.   SEEK MEDICAL CARE IF:   You notice redness, swelling, or increasing pain in the wound.   You notice a bad smell coming from the wound or dressing.   You develop a rash.   You need stronger pain medicine.   You are having an allergic reaction or problems with your medicines.  SEEK IMMEDIATE MEDICAL CARE IF:   You have difficulty breathing.   You have a fever.   There is increased bleeding (more than a small spot) from the wound.   Pus is coming from the wound.   The wound is breaking open.  Document Released: 09/24/2005 Document Revised: 06/06/2011 Document Reviewed: 08/12/2009 [employer] Patient Information 2012 [employer]       Level of Service     PR OFFICE CONSULTATION,LEVEL IV [99244]         All Flowsheet Templates (all recorded)       Encounter Vitals Flowsheet    Custom Formula Data Flowsheet    Anthropometrics Flowsheet                      All Charges for This Encounter       Code Description Service Date Service Provider Modifiers Quantity    99244 PR OFFICE CONSULTATION,LEVEL IV 07/30/2011 Kaiyla Stahly M Diamante Rubin, MD   1        Other Encounter Related Information     Allergies & Medications         Problem List         History         Patient-Entered Questionnaires     No data filed         

## 2011-08-22 ENCOUNTER — Encounter (HOSPITAL_COMMUNITY): Payer: Self-pay | Admitting: *Deleted

## 2011-08-22 ENCOUNTER — Ambulatory Visit
Admission: RE | Admit: 2011-08-22 | Discharge: 2011-08-22 | Disposition: A | Discharge status: Home / Self Care | Payer: PRIVATE HEALTH INSURANCE | Source: Ambulatory Visit | Attending: General Surgery | Admitting: General Surgery

## 2011-08-22 ENCOUNTER — Ambulatory Visit (HOSPITAL_COMMUNITY)
Admission: RE | Admit: 2011-08-22 | Discharge: 2011-08-22 | Disposition: A | Discharge status: Home / Self Care | Payer: PRIVATE HEALTH INSURANCE | Source: Ambulatory Visit | Attending: General Surgery | Admitting: General Surgery

## 2011-08-22 ENCOUNTER — Ambulatory Visit (HOSPITAL_COMMUNITY): Payer: PRIVATE HEALTH INSURANCE | Admitting: Anesthesiology

## 2011-08-22 ENCOUNTER — Encounter (HOSPITAL_COMMUNITY): Payer: Self-pay | Admitting: Anesthesiology

## 2011-08-22 ENCOUNTER — Encounter (HOSPITAL_COMMUNITY)
Admission: RE | Disposition: A | Discharge status: Home / Self Care | Payer: Self-pay | Source: Ambulatory Visit | Attending: General Surgery

## 2011-08-22 ENCOUNTER — Other Ambulatory Visit (INDEPENDENT_AMBULATORY_CARE_PROVIDER_SITE_OTHER): Payer: Self-pay | Admitting: General Surgery

## 2011-08-22 DIAGNOSIS — D249 Benign neoplasm of unspecified breast: Secondary | ICD-10-CM

## 2011-08-22 DIAGNOSIS — F17200 Nicotine dependence, unspecified, uncomplicated: Secondary | ICD-10-CM | POA: Insufficient documentation

## 2011-08-22 DIAGNOSIS — R920 Mammographic microcalcification found on diagnostic imaging of breast: Secondary | ICD-10-CM

## 2011-08-22 DIAGNOSIS — D369 Benign neoplasm, unspecified site: Secondary | ICD-10-CM

## 2011-08-22 DIAGNOSIS — Z01812 Encounter for preprocedural laboratory examination: Secondary | ICD-10-CM | POA: Insufficient documentation

## 2011-08-22 DIAGNOSIS — E039 Hypothyroidism, unspecified: Secondary | ICD-10-CM | POA: Insufficient documentation

## 2011-08-22 HISTORY — PX: BREAST BIOPSY: SHX20

## 2011-08-22 SURGERY — BREAST BIOPSY WITH NEEDLE LOCALIZATION
Anesthesia: General | Site: Breast | Laterality: Left | Wound class: Clean

## 2011-08-22 MED ORDER — PROPOFOL 10 MG/ML IV EMUL
INTRAVENOUS | Status: DC | PRN
  Administered 2011-08-22: 200 mg via INTRAVENOUS

## 2011-08-22 MED ORDER — ONDANSETRON HCL 4 MG/2ML IJ SOLN
INTRAMUSCULAR | Status: DC | PRN
  Administered 2011-08-22: 4 mg via INTRAVENOUS

## 2011-08-22 MED ORDER — MIDAZOLAM HCL 5 MG/5ML IJ SOLN
INTRAMUSCULAR | Status: DC | PRN
  Administered 2011-08-22: 2 mg via INTRAVENOUS

## 2011-08-22 MED ORDER — FENTANYL CITRATE 0.05 MG/ML IJ SOLN
INTRAMUSCULAR | Status: DC | PRN
  Administered 2011-08-22: 100 ug via INTRAVENOUS
  Administered 2011-08-22 (×3): 50 ug via INTRAVENOUS

## 2011-08-22 MED ORDER — CEFAZOLIN SODIUM-DEXTROSE 2-3 GM-% IV SOLR
2.0000 g | Freq: Once | INTRAVENOUS | Status: AC
  Administered 2011-08-22: 2 g via INTRAVENOUS
  Filled 2011-08-22: qty 50

## 2011-08-22 MED ORDER — HYDROMORPHONE HCL PF 1 MG/ML IJ SOLN
0.2500 mg | INTRAMUSCULAR | Status: DC | PRN
  Administered 2011-08-22 (×4): 0.25 mg via INTRAVENOUS

## 2011-08-22 MED ORDER — LACTATED RINGERS IV SOLN: 1000.0000 mL | INTRAVENOUS | Status: DC

## 2011-08-22 MED ORDER — ONDANSETRON HCL 4 MG/2ML IJ SOLN: 4.0000 mg | Freq: Once | INTRAMUSCULAR | Status: DC | PRN

## 2011-08-22 MED ORDER — LACTATED RINGERS IV SOLN
INTRAVENOUS | Status: DC | PRN
  Administered 2011-08-22 (×2): via INTRAVENOUS

## 2011-08-22 MED ORDER — HYDROCODONE-ACETAMINOPHEN 5-325 MG PO TABS: 1.0000 | ORAL_TABLET | ORAL | Status: AC | PRN

## 2011-08-22 MED ORDER — LIDOCAINE-EPINEPHRINE (PF) 1 %-1:200000 IJ SOLN
INTRAMUSCULAR | Status: DC | PRN
  Administered 2011-08-22: 10 mL via INTRADERMAL

## 2011-08-22 MED ORDER — ROCURONIUM BROMIDE 100 MG/10ML IV SOLN
INTRAVENOUS | Status: DC | PRN
  Administered 2011-08-22: 35 mg via INTRAVENOUS

## 2011-08-22 SURGICAL SUPPLY — 39 items
APPLIER CLIP 9.375 MED OPEN (MISCELLANEOUS)
BINDER BREAST LRG (GAUZE/BANDAGES/DRESSINGS) IMPLANT
BINDER BREAST XLRG (GAUZE/BANDAGES/DRESSINGS) ×2 IMPLANT
CANISTER SUCTION 2500CC (MISCELLANEOUS) ×2 IMPLANT
CHLORAPREP W/TINT 26ML (MISCELLANEOUS) ×2 IMPLANT
CLIP APPLIE 9.375 MED OPEN (MISCELLANEOUS) IMPLANT
CLOTH BEACON ORANGE TIMEOUT ST (SAFETY) ×2 IMPLANT
COVER SURGICAL LIGHT HANDLE (MISCELLANEOUS) ×2 IMPLANT
DECANTER SPIKE VIAL GLASS SM (MISCELLANEOUS) ×2 IMPLANT
DERMABOND ADVANCED (GAUZE/BANDAGES/DRESSINGS) ×1
DERMABOND ADVANCED .7 DNX12 (GAUZE/BANDAGES/DRESSINGS) ×1 IMPLANT
DEVICE DUBIN SPECIMEN MAMMOGRA (MISCELLANEOUS) IMPLANT
DRAPE LAPAROSCOPIC ABDOMINAL (DRAPES) ×2 IMPLANT
ELECT CAUTERY BLADE 6.4 (BLADE) ×2 IMPLANT
ELECT REM PT RETURN 9FT ADLT (ELECTROSURGICAL) ×2
ELECTRODE REM PT RTRN 9FT ADLT (ELECTROSURGICAL) ×1 IMPLANT
GLOVE EUDERMIC 7 POWDERFREE (GLOVE) IMPLANT
GLOVE SURG SS PI 6.5 STRL IVOR (GLOVE) ×2 IMPLANT
GLOVE SURG SS PI 8.5 STRL IVOR (GLOVE) ×1
GLOVE SURG SS PI 8.5 STRL STRW (GLOVE) ×1 IMPLANT
GOWN PREVENTION PLUS XLARGE (GOWN DISPOSABLE) ×2 IMPLANT
GOWN STRL NON-REIN LRG LVL3 (GOWN DISPOSABLE) ×2 IMPLANT
KIT BASIN OR (CUSTOM PROCEDURE TRAY) ×2 IMPLANT
KIT MARKER MARGIN INK (KITS) ×2 IMPLANT
KIT ROOM TURNOVER OR (KITS) ×2 IMPLANT
NEEDLE HYPO 25GX1X1/2 BEV (NEEDLE) ×2 IMPLANT
NS IRRIG 1000ML POUR BTL (IV SOLUTION) ×2 IMPLANT
PACK GENERAL/GYN (CUSTOM PROCEDURE TRAY) ×2 IMPLANT
PAD ARMBOARD 7.5X6 YLW CONV (MISCELLANEOUS) ×2 IMPLANT
SPONGE LAP 4X18 X RAY DECT (DISPOSABLE) IMPLANT
STAPLER VISISTAT 35W (STAPLE) ×2 IMPLANT
SUT MNCRL AB 4-0 PS2 18 (SUTURE) ×2 IMPLANT
SUT SILK 2 0 SH (SUTURE) ×2 IMPLANT
SUT VIC AB 3-0 SH 18 (SUTURE) ×2 IMPLANT
SUT VICRYL AB 3 0 TIES (SUTURE) IMPLANT
SYR CONTROL 10ML LL (SYRINGE) ×2 IMPLANT
TOWEL OR 17X24 6PK STRL BLUE (TOWEL DISPOSABLE) ×2 IMPLANT
TOWEL OR 17X26 10 PK STRL BLUE (TOWEL DISPOSABLE) ×2 IMPLANT
WATER STERILE IRR 1000ML POUR (IV SOLUTION) IMPLANT

## 2011-08-22 NOTE — Anesthesia Procedure Notes (Signed)
Procedure Name: Intubation Date/Time: 08/22/2011 10:18 AM Performed by: Romie Minus Pre-anesthesia Checklist: Patient identified, Emergency Drugs available, Suction available and Patient being monitored Patient Re-evaluated:Patient Re-evaluated prior to inductionOxygen Delivery Method: Circle System Utilized Preoxygenation: Pre-oxygenation with 100% oxygen Intubation Type: IV induction Ventilation: Two handed mask ventilation required and Oral airway inserted - appropriate to patient size Tube type: Oral Tube size: 7.5 mm Number of attempts: 1 Airway Equipment and Method: video-laryngoscopy Placement Confirmation: ETT inserted through vocal cords under direct vision,  positive ETCO2 and breath sounds checked- equal and bilateral Secured at: 22 cm Tube secured with: Tape Dental Injury: Teeth and Oropharynx as per pre-operative assessment

## 2011-08-22 NOTE — Op Note (Signed)
Patient Name:   Callahan Peddie  Date of Surgery:   08/22/2011  Pre op Diagnosis:   Intraductal papilloma and calcifications left breast.  Post op Diagnosis:   Intraductal papilloma and calcifications left breast  Procedure:    Left partial mastectomy with needle localization, specimen mammogram  Surgeon:   Angelia Mould. Derrell Lolling, M.D., FACS  Assistant:   none  Operative Indications:   This 45 year old Caucasian female has had a nonbloody left nipple discharge for several years. It is spontaneous. On exam she has no palpable mass. Imaging studies recently showed several calcifications within a left breast duct extending to almost centimeter deep to the nipple. This was at the 9:00 position of the left breast. Ductogram showed a defect 3.8 cm deep to the nipple and extending into several branches of the duct. No biopsy was performed by the radiologist. She was evaluated by me. I advised that we excise this area using needle localization as well as lacrimal duct probe localization. She is brought the operating room electively.  Operative Findings:   The left nipple duct was at the 9 and clock position of the left nipple. I was able to intubate this with a lacrimal duct probe. There was no palpable mass. The specimen mammogram looked good with the area of concern in the center of the specimen.  Procedure in Detail:   The patient underwent wire localization at the [facility]. The wire entered medially and was directed laterally. The wire position was good. The patient was taken to the operating [facility] Gen. Anesthesia was induced. Antibiotics were given. Surgical timeout was held identifying correct patient, correct procedure, and correct site.  1% Xylocaine with epinephrine was used as a local infiltration anesthetic. A curvilinear incision was made at the medial aspect of the left areolar margin. Dissection was carried down into the breast tissue. I intubated the  involved nipple duct with a lacrimal duct probe and using the lacrimal duct probe and the localizing wire I completely excised the area in question. The specimen was removed and marked with the 6 color margin marker. Specimen mammogram showed that the wire and the area of concern within the breast was completely removed. The specimen was marked and sent to pathology. Hemostasis was excellent and achieved with cautery. The breast tissue was reconstructed with multiple interrupted sutures of 3-0 Vicryl and the skin closed with a running subcuticular suture of 4-0 Monocryl and Dermabond. Estimated blood loss was 15 cc. Complications none. Instrument counts were correct.   Samiha Denapoli M 08/22/2011 11:02 AM

## 2011-08-22 NOTE — Anesthesia Preprocedure Evaluation (Addendum)
Anesthesia Evaluation  Patient identified by MRN, date of birth, ID band Patient awake    Reviewed: Allergy & Precautions, H&P , NPO status , Patient's Chart, lab work & pertinent test results  Airway Mallampati: III TM Distance: >3 FB Neck ROM: Full    Dental  (+) Teeth Intact   Pulmonary Current Smoker,  Cold 3 weeks ago, which turned into bronchitis         Cardiovascular     Neuro/Psych Seizures - (from topamax),  PSYCHIATRIC DISORDERS Anxiety Depression    GI/Hepatic Congenital bleeding ulcer; resolved at 5 months   Endo/Other  Hypothyroidism   Renal/GU   Female genitourinary complaint: stress incontinence.     Musculoskeletal   Abdominal   Peds  Hematology   Anesthesia Other Findings   Reproductive/Obstetrics postmenopausal                          Anesthesia Physical Anesthesia Plan  ASA: III  Anesthesia Plan: General   Post-op Pain Management:    Induction: Intravenous  Airway Management Planned: Oral ETT  Additional Equipment:   Intra-op Plan:   Post-operative Plan: Extubation in OR  Informed Consent: I have reviewed the patients History and Physical, chart, labs and discussed the procedure including the risks, benefits and alternatives for the proposed anesthesia with the patient or authorized representative who has indicated his/her understanding and acceptance.     Plan Discussed with: CRNA  Anesthesia Plan Comments:         Anesthesia Quick Evaluation

## 2011-08-22 NOTE — Preoperative (Signed)
Beta Blockers   Reason not to administer Beta Blockers:Not Applicable 

## 2011-08-22 NOTE — Anesthesia Postprocedure Evaluation (Signed)
  Anesthesia Post-op Note  Patient: Theresa Castillo  Procedure(s) Performed:  BREAST BIOPSY WITH NEEDLE LOCALIZATION - Left  BREAST DUCT EXCISION & PARATIAL MASTECTOMY W/NEEDLE LOCALIZATION  Patient Location: PACU  Anesthesia Type: General  Level of Consciousness: awake  Airway and Oxygen Therapy: Patient Spontanous Breathing  Post-op Pain: mild  Post-op Assessment: Post-op Vital signs reviewed  Post-op Vital Signs: stable  Complications: No apparent anesthesia complications 

## 2011-08-22 NOTE — Transfer of Care (Signed)
Immediate Anesthesia Transfer of Care Note  Patient: Theresa Castillo  Procedure(s) Performed:  BREAST BIOPSY WITH NEEDLE LOCALIZATION - Left  BREAST DUCT EXCISION & PARATIAL MASTECTOMY W/NEEDLE LOCALIZATION  Patient Location: PACU  Anesthesia Type: General  Level of Consciousness: awake  Airway & Oxygen Therapy: Patient Spontanous Breathing and Patient connected to nasal cannula oxygen  Post-op Assessment: Report given to PACU RN and Post -op Vital signs reviewed and stable  Post vital signs: stable  Complications: No apparent anesthesia complications

## 2011-08-22 NOTE — H&P (View-Only) (Signed)
Theresa Castillo   07/30/2011 2:45 PM Office Visit  MRN: [id number]   Description: 45 year old female  Provider: Ernestene Mention, MD  Department: [facility]        Diagnoses     Intraductal papilloma of breast   - Primary    217      Reason for Visit     Other    new pt- eval papilloma of lt breast        Vitals - Last Recorded       BP Pulse Temp(Src) Resp Ht Wt    116/88  84  97.2 F (36.2 C) (Temporal)  20  5\' 7"  (1.702 m)  258 lb 3.2 oz (117.119 kg)          BMI              40.44 kg/m2                 Progress Notes     Ernestene Mention, MD  07/30/2011  3:58 PM  SignedChief Complaint   Patient presents with   .  Other       new pt- eval papilloma of lt breast      HPI Theresa Castillo is a 45 y.o. female.     This is a 45 year old Caucasian female, referred through  the courtesy of Dr. 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 Her primary care physician is [facility].   [place] She has moved here with her husband. She states that she's had a left nipple discharge for 10 years. She says this is spontaneous. It has never been bloody. No prior breast problems. She presented for imaging studies recently.  they found several calcifications within a left breast duct extending to almost 5 cm deep to the nipple. They also found a dilated duct at the 9:00 position of the left breast. Ductogram showed a filling defect 3.8 cm deep to the nipple and extending into several branches of the duct. They did not perform any biopsy. She was sent for surgical excision.   Family history is significant for cancer in the mother at age 64 who had chemotherapy. Also one maternal aunt and 2 paternal aunts had breast cancer. There is no family history of ovarian cancer.   HPI    Past Medical History   Diagnosis  Date   .  Allergy      pineapple abd pain and diarrhea   .  Anxiety     .  Depression     .  Thyroid disease         borderline hypo   .  Ulcer         born with stomach ulcer   .  Hyperlipidemia     .  Seizures         in result of topamax       Past Surgical History   Procedure  Date   .  Cholecystectomy  1995   .  Salpingoophorectomy  1984       right   .  Ankle surgery  2003       right   .  Pilonial cyst removed twice     .  Tubal ligation     .  Oophorectomy     .  Uterine fibroid surgery  Family History   Problem  Relation  Age of Onset   .  Colon cancer  Father     .  Cancer  Mother         breast   .  Cancer  Maternal Aunt         breast and thyroid   .  Cancer  Paternal Aunt         breast   .  Cancer  Maternal Grandmother         colon      Social History History   Substance Use Topics   .  Smoking status:  Current Everyday Smoker -- 1.5 packs/day       Types:  Cigarettes   .  Smokeless tobacco:  Never Used   .  Alcohol Use:  No         rarely       Allergies   Allergen  Reactions   .  Ammonia  Swelling       Throat swells shut   .  Amoxicillin  Other (See Comments)       Passing out   .  Topamax         Caused seizures         Current Outpatient Prescriptions   Medication  Sig  Dispense  Refill   .  ALPRAZolam (XANAX) 0.5 MG tablet  Take 0.5 mg by mouth as needed. anxiety         .  Bioflavonoid Products (GRAPE SEED PO)  Take by mouth 2 (two) times daily.           Marland Kitchen  CRANBERRY PO  Take 4,200 mg by mouth daily.           .  folic acid (FOLVITE) 1 MG tablet  Take 2 mg by mouth daily.           .  Lactobacillus (ACIDOPHILUS PO)  Take by mouth daily.           Marland Kitchen  levothyroxine (SYNTHROID, LEVOTHROID) 25 MCG tablet  Take 25 mcg by mouth daily.           .  Multiple Vitamin (MULTIVITAMIN PO)  Take 1 tablet by mouth daily.           Marland Kitchen  OLANZapine (ZYPREXA) 5 MG tablet  Take 5 mg by mouth every morning.          .  Omega-3 Fatty Acids (FISH OIL PO)  Take  2,400 mg by mouth daily.           Marland Kitchen  venlafaxine (EFFEXOR-XR) 150 MG 24 hr capsule  Take 150 mg by mouth 2 (two) times daily.           Marland Kitchen  zolpidem (AMBIEN) 10 MG tablet  Take 10 mg by mouth at bedtime as needed.              Review of Systems Review of Systems  Constitutional: Negative for fever, chills and unexpected weight change.  HENT: Negative for hearing loss, congestion, sore throat, trouble swallowing and voice change.   Eyes: Negative for visual disturbance.  Respiratory: Positive for cough. Negative for wheezing.   Cardiovascular: Negative for chest pain, palpitations and leg swelling.  Gastrointestinal: Negative for nausea, vomiting, abdominal pain, diarrhea, constipation, blood in stool, abdominal distention and anal bleeding.  Genitourinary: Negative for hematuria, vaginal bleeding and difficulty urinating.  Musculoskeletal: Negative for arthralgias.  Skin: Negative for rash and  wound.  Neurological: Positive for seizures. Negative for syncope and headaches.  Hematological: Negative for adenopathy. Does not bruise/bleed easily.  Psychiatric/Behavioral: Negative for confusion.    Blood pressure 116/88, pulse 84, temperature 97.2 F (36.2 C), temperature source Temporal, resp. rate 20, height 5\' 7"  (1.702 m), weight 258 lb 3.2 oz (117.119 kg).   Physical Exam Physical Exam  Constitutional: She is oriented to person, place, and time. She appears well-developed and well-nourished. No distress.  HENT:   Head: Normocephalic and atraumatic.   Nose: Nose normal.   Mouth/Throat: No oropharyngeal exudate.  Eyes: Conjunctivae and EOM are normal. Pupils are equal, round, and reactive to light. Left eye exhibits no discharge. No scleral icterus.  Neck: Neck supple. No JVD present. No tracheal deviation present. No thyromegaly present.  Cardiovascular: Normal rate, regular rhythm, normal heart sounds and intact distal pulses.    No murmur heard. Pulmonary/Chest: Effort normal  and breath sounds normal. No respiratory distress. She has no wheezes. She has no rales. She exhibits no tenderness.    Abdominal: Soft. Bowel sounds are normal. She exhibits no distension and no mass. There is no tenderness. There is no rebound and no guarding.       laparoscopy scars. pfannensteil scar.  Musculoskeletal: She exhibits no edema and no tenderness.  Lymphadenopathy:    She has no cervical adenopathy.  Neurological: She is alert and oriented to person, place, and time. She exhibits normal muscle tone. Coordination normal.  Skin: Skin is warm. No rash noted. She is not diaphoretic. No erythema. No pallor.  Psychiatric: She has a normal mood and affect. Her behavior is normal. Judgment and thought content normal.    Data Reviewed I reviewed the mammograms, the ductogram, and some of her old records online.               Assessment Non-bloody discharge left nipple, associated with calcifications and a filling defect on ductogram. 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 She will need this area excised to rule out occult carcinoma in situ.   Strong family history of breast cancer in first and second line relatives.   Anxiety and depression   Seizure disorder   History laparoscopic cholecystectomy   History of right salpingo-oophorectomy for benign disease.   Plan Schedule for excisional biopsy. I need to to use lacrimal duct probes to attempt to isolate this duct, and am also going to ask radiology to do a needle localization and specimen mammogram so I can be sure of the orientation and extent of calcifications to be removed.   I discussed the indications and details of surgery with her. Risks and complications have been outlined, including not limited to bleeding, infection, cosmetic deformity, reoperation for cancer, nerve damage chronic pain or numbness, skin necrosis, abnormal nipple sensation, and other proceed problems. She is understanding the issues well. The  tunnel for questions are answered. She is in full agreement with this plan.       Michaele Amundson M 07/30/2011, 3:48 PM                Not recorded       Discontinued Medications         Reason for Discontinue    OLANZapine (ZYPREXA) 10 MG tablet Error           Patient Instructions     You have an abnormal discharge from your left breast nipple. There is a dilated duct, calcifications, and a filling defect. We will need to remove this  entire area, and we will schedule you for this surgery. We will ask the radiologist to put a wire and this to be sure that we can target the complete extent of the calcifications. We will proceed with scheduling of the surgery in the near future.   Breast Biopsy WHY YOU NEED A BIOPSY  Your caregiver has recommended that you have a breast tissue sample taken (biopsy). This is done to be certain that the lump or abnormality found in your breast is not cancerous (malignant). During a biopsy, a small piece of tissue is removed, so it can be examined under a microscope by a specialist (pathologist) who looks at tissues and cells and diagnoses abnormalities in them. Most lumps (tumors) or abnormalities, on or in the breast, are not cancerous (benign). However, biopsies are taken when your caregiver cannot be absolutely certain of what is wrong only from doing a physical exam, mammogram (breast X-ray), or other studies. A breast biopsy can tell you whether nothing more needs to be done, or you need more surgery or another type of treatment. A biopsy is done when there is: Any undiagnosed breast mass.   Nipple abnormalities, dimpling, crusting, or ulcerations.   Calcium deposits (calcifications) or abnormalities seen on your mammogram, ultrasound, or MRI.   Suspicious changes in the breast (thickening, asymmetry) seen on mammogram.   Abnormal discharge from the nipple, especially blood.   Redness, swelling, and pain of the breast.  HOW A BIOPSY  IS PERFORMED A biopsy is often performed on an outpatient basis (you go home the same day). This can be done in a hospital, clinic, or surgical center. Tissue samples (biopsies) are often done under local anesthesia (area is numbed). Sometimes general anesthetics are required, in which case you sleep through the procedure. Biopsies may remove the entire lump, a small piece of the lump, or a small sliver of tissue removed by needle. TYPES OF BREAST BIOPSY Fine needle aspiration. A thin needle is placed through the skin, to the lump or cyst, and cells are removed.   Core needle biopsy. A large needle with a special tip is placed through the skin, to the abnormality, and a piece of tissue is removed.   Stereotactic biopsy. A core needle with a special X-ray is used, to direct the needle to the lump or abnormal area, which is difficult to feel or cannot be felt.   Vacuum-assisted biopsy. A hollow probe and a gentle vacuum remove a sample of tissue.   Ultrasound guided core needle biopsy. You lie on your stomach, with your breast through an opening, and a high frequency ultrasound helps guide the needle to the area of the abnormality.   Open biopsy. An incision is made in the breast, and a piece of the lump or the whole lump is removed.  LET YOUR CAREGIVER KNOW ABOUT: Allergies.   Medicines taken, including herbs, eye drops, over-the-counter medicines, and creams.   Use of steroids (by mouth or creams).   Previous problems with anesthetics or Novocaine.   If you are taking aspirin or blood thinners.   Possibility of pregnancy, if this applies.   History of blood clots (thrombophlebitis).   History of bleeding or blood problems.   Previous surgery.   Other health problems.  RISKS AND COMPLICATIONS   Bleeding.   Infection.   Allergy to medicines.   Bruising and swelling of the breast.   Alteration in the shape of the breast.   Not finding the lump or abnormality.  Needing more surgery.  BEFORE  THE PROCEDURE You should arrive 60 minutes prior to your procedure or as directed.   Check-in at the admissions desk, to fill out necessary forms, if you are not preregistered.   There will be consent forms to sign, prior to the procedure.   There is a waiting area for your family, while you are having your biopsy.   Try to have someone with you, to drive you home.   Do not smoke for 2 weeks before the surgery.   Let your caregiver know if you develop a cold or an infection.   Do not drink alcohol for at least 24 hours before surgery.   Wear a good support bra to the surgery.  AFTER THE PROCEDURE After surgery, you will be taken to the recovery area, where a nurse will watch and check your progress. Once you are awake, stable, and taking fluids well, if there are no other problems, you will be allowed to go home.   Ice packs applied to your operative site may help with discomfort and keep the swelling down.   You may resume normal diet and activities as directed. Avoid strenuous activities affecting the arm on the side of the biopsy, such as tennis, swimming, heavy lifting (more than 10 pounds) or pulling.   Bruising in the breast is normal following this procedure.   Wearing a support bra, even to bed, may be more comfortable. The bra will also help keep the dressing on.   Change dressings as directed.   Your doctor may apply a pressure dressing on your breast for 24 to 48 hours.   Only take over-the-counter or prescription medicines for pain, discomfort, or fever as directed by your caregiver.   Do not take aspirin, because it can cause bleeding.  HOME CARE INSTRUCTIONS   You may resume your usual diet.   Have someone drive you home after the surgery.   Do not do any exercise, driving, lifting or general activities without your caregiver's permission.   Take medicines and over-the-counter medicines, as ordered by your caregiver.   Keep your postoperative appointments as recommended.   Do  not drink alcohol while taking pain medicine.  Finding out the results of your test Not all test results are available during your visit. If your test results are not back during the visit, make an appointment with your caregiver to find out the results. Do not assume everything is normal if you have not heard from your caregiver or the medical facility. It is important for you to follow up on all of your test results.   SEEK MEDICAL CARE IF:   You notice redness, swelling, or increasing pain in the wound.   You notice a bad smell coming from the wound or dressing.   You develop a rash.   You need stronger pain medicine.   You are having an allergic reaction or problems with your medicines.  SEEK IMMEDIATE MEDICAL CARE IF:   You have difficulty breathing.   You have a fever.   There is increased bleeding (more than a small spot) from the wound.   Pus is coming from the wound.   The wound is breaking open.  Document Released: 09/24/2005 Document Revised: 06/06/2011 Document Reviewed: 08/12/2009 [facility] Patient Information 2012 [place], [place].       Level of Service     PR OFFICE CONSULTATION,LEVEL IV [id number]         All Flowsheet Templates (all recorded)  Encounter Vitals Flowsheet    Custom Formula Data Flowsheet    Anthropometrics Flowsheet                      All Charges for This Encounter       Code Description Service Date Service Provider Modifiers Quantity    [phone] PR OFFICE CONSULTATION,LEVEL IV 07/30/2011 Ernestene Mention, MD   1        Other Encounter Related Information     Allergies & Medications         Problem List         History         Patient-Entered Questionnaires     No data filed

## 2011-08-22 NOTE — Interval H&P Note (Signed)
History and Physical Interval Note:   08/22/2011   9:45 AM   Theresa Castillo  has presented today for surgery, with the diagnosis of LEFT DUCTAL PAPILLOMA MICROCALCIFICATIONS  The various methods of treatment have been discussed with the patient and family. After consideration of risks, benefits and other options for treatment, the patient has consented to  Procedure(s): LEFT BREAST BIOPSY WITH NEEDLE LOCALIZATION as a surgical intervention .  The patients' history has been reviewed, patient examined, no change in status, stable for surgery.  I have reviewed the patients' chart and labs.  Questions were answered to the patient's satisfaction.     Ernestene Mention  MD

## 2011-08-23 NOTE — Anesthesia Postprocedure Evaluation (Signed)
  Anesthesia Post-op Note  Patient: Theresa Castillo  Procedure(s) Performed:  BREAST BIOPSY WITH NEEDLE LOCALIZATION - Left  BREAST DUCT EXCISION & PARATIAL MASTECTOMY W/NEEDLE LOCALIZATION  Patient Location: PACU  Anesthesia Type: General  Level of Consciousness: awake  Airway and Oxygen Therapy: Patient Spontanous Breathing  Post-op Pain: mild  Post-op Assessment: Post-op Vital signs reviewed  Post-op Vital Signs: stable  Complications: No apparent anesthesia complications

## 2011-08-23 NOTE — Anesthesia Postprocedure Evaluation (Signed)
  Anesthesia Post-op Note  Patient: Theresa Castillo  Procedure(s) Performed:  BREAST BIOPSY WITH NEEDLE LOCALIZATION - Left  BREAST DUCT EXCISION & PARATIAL MASTECTOMY W/NEEDLE LOCALIZATION  Patient Location: PACU  Anesthesia Type: General  Level of Consciousness: awake  Airway and Oxygen Therapy: Patient Spontanous Breathing  Post-op Pain: mild  Post-op Assessment: Post-op Vital signs reviewed  Post-op Vital Signs: stable  Complications: No apparent anesthesia complications 

## 2011-08-24 ENCOUNTER — Telehealth (INDEPENDENT_AMBULATORY_CARE_PROVIDER_SITE_OTHER): Payer: Self-pay

## 2011-08-24 NOTE — Telephone Encounter (Signed)
LMOM to call for path.

## 2011-08-24 NOTE — Telephone Encounter (Signed)
PT CALLED. PATH RESULT GIVEN AND PO APPT MADE.

## 2011-08-28 ENCOUNTER — Encounter (HOSPITAL_COMMUNITY): Payer: Self-pay | Admitting: General Surgery

## 2011-09-06 ENCOUNTER — Encounter (INDEPENDENT_AMBULATORY_CARE_PROVIDER_SITE_OTHER): Payer: PRIVATE HEALTH INSURANCE | Admitting: General Surgery

## 2011-09-24 ENCOUNTER — Encounter (INDEPENDENT_AMBULATORY_CARE_PROVIDER_SITE_OTHER): Payer: PRIVATE HEALTH INSURANCE | Admitting: General Surgery

## 2011-10-15 ENCOUNTER — Ambulatory Visit (INDEPENDENT_AMBULATORY_CARE_PROVIDER_SITE_OTHER): Payer: PRIVATE HEALTH INSURANCE | Admitting: General Surgery

## 2011-10-15 ENCOUNTER — Encounter (INDEPENDENT_AMBULATORY_CARE_PROVIDER_SITE_OTHER): Payer: Self-pay | Admitting: General Surgery

## 2011-10-15 VITALS — BP 108/72 | HR 110 | Temp 97.4°F | Ht 67.0 in | Wt 256.0 lb

## 2011-10-15 DIAGNOSIS — Z9889 Other specified postprocedural states: Secondary | ICD-10-CM

## 2011-10-15 NOTE — Progress Notes (Signed)
Subjective:     Patient ID: Theresa Castillo, female   DOB: 03/06/1966, 46 y.o.   MRN: [id number]  HPI This patient underwent left breast biopsy on August 22, 2011. Final pathology report shows a benign intraductal papilloma with calcifications. The nipple discharge has stopped. The wound has healed nicely. She feels fine. I gave her a copy of the pathology report.  Review of Systems     Objective:   Physical Exam Constitutional: Patient looks well. Strong odor of tobacco.  Breasts: Left breast is examined. Circumareolar incision looks very good. No infection. No drainage. No hematoma. Nipple and areolar skin looked normal. No drainage.    Assessment:     Intraductal papilloma and calcifications left breast, subareolar, uneventful recovery following left breast biopsy.    Plan:     Recommend annual mammography and annual breast exam with her primary care physician.  Return to see me p.r.n.  Angelia Mould. Derrell Lolling, M.D., [facility] General and Minimally invasive Surgery Breast and Colorectal Surgery Office:   [phone] Pager:   [phone]

## 2011-10-15 NOTE — Patient Instructions (Signed)
Your left breast incision is healing without any problems. The final pathology report shows a benign intraductal papilloma. You have been given a copy of the pathology report. I advised you to get an annual breast exam with your primary care physician, and I recommended that you get annual mammography. Return to see me if any new problems occur.

## 2012-04-15 ENCOUNTER — Encounter (HOSPITAL_COMMUNITY): Payer: PRIVATE HEALTH INSURANCE | Attending: Oncology | Admitting: Oncology

## 2012-04-15 ENCOUNTER — Encounter (HOSPITAL_COMMUNITY): Payer: Self-pay | Admitting: Oncology

## 2012-04-15 VITALS — BP 105/73 | HR 80 | Temp 98.3°F | Ht 66.25 in | Wt 255.8 lb

## 2012-04-15 DIAGNOSIS — F17200 Nicotine dependence, unspecified, uncomplicated: Secondary | ICD-10-CM | POA: Insufficient documentation

## 2012-04-15 DIAGNOSIS — D72829 Elevated white blood cell count, unspecified: Secondary | ICD-10-CM | POA: Insufficient documentation

## 2012-04-15 DIAGNOSIS — Z6841 Body Mass Index (BMI) 40.0 and over, adult: Secondary | ICD-10-CM | POA: Insufficient documentation

## 2012-04-15 DIAGNOSIS — E669 Obesity, unspecified: Secondary | ICD-10-CM | POA: Insufficient documentation

## 2012-04-15 DIAGNOSIS — J449 Chronic obstructive pulmonary disease, unspecified: Secondary | ICD-10-CM

## 2012-04-15 LAB — DIFFERENTIAL
Basophils Absolute: 0.1 10*3/uL (ref 0.0–0.1)
Basophils Relative: 1 % (ref 0–1)
Lymphocytes Relative: 23 % (ref 12–46)
Monocytes Relative: 4 % (ref 3–12)
Neutro Abs: 12.2 10*3/uL — ABNORMAL HIGH (ref 1.7–7.7)
Neutrophils Relative %: 70 % (ref 43–77)

## 2012-04-15 LAB — CBC
Hemoglobin: 15.4 g/dL — ABNORMAL HIGH (ref 12.0–15.0)
MCH: 32.5 pg (ref 26.0–34.0)
Platelets: 319 10*3/uL (ref 150–400)
RBC: 4.74 MIL/uL (ref 3.87–5.11)
WBC: 17.5 10*3/uL — ABNORMAL HIGH (ref 4.0–10.5)

## 2012-04-15 NOTE — Progress Notes (Signed)
Theresa Castillo presented for labwork. Labs per MD order drawn via Peripheral Line 23 gauge needle inserted in right AC  Good blood return present. Procedure without incident.  Needle removed intact. Patient tolerated procedure well.
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
## 2012-04-15 NOTE — Patient Instructions (Addendum)
Theresa Castillo  [id number] [birth year] Dr. Glenford Peers   [facility]  Discharge Instructions  RECOMMENDATIONS MADE BY THE CONSULTANT AND ANY TEST RESULTS WILL BE SENT TO YOUR REFERRING DOCTOR.   EXAM FINDINGS BY MD TODAY AND SIGNS AND SYMPTOMS TO REPORT TO CLINIC OR PRIMARY MD: exam and discussion per MD.  Think your elevated white blood count is related to inflammation that may be related to your smoking and irritated gums.  We need to check some labs today , MD will examine your blood smear and we will get you scheduled for a CT of your chest because of your long smoking history.  Need to stop smoking for 8 weeks and we will re-check your blood work. Hypnotist is Dr. Stevphen Meuse in [place] [phone].  Want you to floss and brush your teeth twice daily.  MEDICATIONS PRESCRIBED: none   INSTRUCTIONS GIVEN AND DISCUSSED:  You also need to see a dermatologist about the pigmented moles that you have on your left mid back and below your left breast.   SPECIAL INSTRUCTIONS/FOLLOW-UP: Lab work Needed today and in 8 weeks, Xray Studies Needed this week and Return to Clinic in 8 weeks after your blood work.   I acknowledge that I have been informed and understand all the instructions given to me and received a copy. I do not have any more questions at this time, but understand that I may call the Specialty Clinic at [facility] at [phone] during business hours should I have any further questions or need assistance in obtaining follow-up care.    __________________________________________  _____________  __________ Signature of Patient or Authorized Representative            Date                   Time    __________________________________________ Nurse's Signature

## 2012-04-16 ENCOUNTER — Telehealth (HOSPITAL_COMMUNITY): Payer: Self-pay | Admitting: Oncology

## 2012-04-17 ENCOUNTER — Telehealth (HOSPITAL_COMMUNITY): Payer: Self-pay | Admitting: *Deleted

## 2012-04-18 ENCOUNTER — Ambulatory Visit (HOSPITAL_COMMUNITY)
Admission: RE | Admit: 2012-04-18 | Discharge: 2012-04-18 | Disposition: A | Discharge status: Home / Self Care | Payer: PRIVATE HEALTH INSURANCE | Source: Ambulatory Visit | Attending: Oncology | Admitting: Oncology

## 2012-04-18 DIAGNOSIS — J4489 Other specified chronic obstructive pulmonary disease: Secondary | ICD-10-CM | POA: Insufficient documentation

## 2012-04-18 DIAGNOSIS — J449 Chronic obstructive pulmonary disease, unspecified: Secondary | ICD-10-CM | POA: Insufficient documentation

## 2012-04-18 DIAGNOSIS — D72829 Elevated white blood cell count, unspecified: Secondary | ICD-10-CM | POA: Insufficient documentation

## 2012-04-24 ENCOUNTER — Other Ambulatory Visit: Payer: Self-pay | Admitting: Obstetrics & Gynecology

## 2012-05-23 ENCOUNTER — Other Ambulatory Visit: Payer: PRIVATE HEALTH INSURANCE | Admitting: Gastroenterology

## 2012-05-28 ENCOUNTER — Ambulatory Visit: Payer: PRIVATE HEALTH INSURANCE | Admitting: Gastroenterology

## 2012-06-16 ENCOUNTER — Encounter (HOSPITAL_COMMUNITY): Payer: PRIVATE HEALTH INSURANCE | Attending: Oncology

## 2012-06-16 DIAGNOSIS — J449 Chronic obstructive pulmonary disease, unspecified: Secondary | ICD-10-CM

## 2012-06-16 DIAGNOSIS — J4489 Other specified chronic obstructive pulmonary disease: Secondary | ICD-10-CM | POA: Insufficient documentation

## 2012-06-16 DIAGNOSIS — D72829 Elevated white blood cell count, unspecified: Secondary | ICD-10-CM

## 2012-06-16 LAB — DIFFERENTIAL
Lymphs Abs: 3.2 10*3/uL (ref 0.7–4.0)
Monocytes Relative: 5 % (ref 3–12)
Neutro Abs: 11.9 10*3/uL — ABNORMAL HIGH (ref 1.7–7.7)
Neutrophils Relative %: 73 % (ref 43–77)

## 2012-06-16 LAB — CBC
HCT: 45 % (ref 36.0–46.0)
Hemoglobin: 15.1 g/dL — ABNORMAL HIGH (ref 12.0–15.0)
RBC: 4.69 MIL/uL (ref 3.87–5.11)

## 2012-06-16 NOTE — Progress Notes (Signed)
Labs drawn today for cbc/diff 

## 2012-06-18 ENCOUNTER — Other Ambulatory Visit: Payer: PRIVATE HEALTH INSURANCE | Admitting: Gastroenterology

## 2012-06-24 ENCOUNTER — Encounter (HOSPITAL_COMMUNITY): Payer: PRIVATE HEALTH INSURANCE | Admitting: Oncology

## 2012-06-24 ENCOUNTER — Telehealth (HOSPITAL_COMMUNITY): Payer: Self-pay

## 2012-06-24 VITALS — BP 132/90 | HR 87 | Temp 98.4°F | Resp 18 | Wt 256.0 lb

## 2012-06-24 NOTE — Progress Notes (Signed)
Patient left without being seen for her leukocytosis. We did call her and she does not think she can quit smoking whatsoever. We therefore we'll go ahead and do a peripheral blood test for BCRABL rearrangement to make sure she does not have CML. She does have a predominance of neutrophils. We will then see her back for followup.

## 2012-06-24 NOTE — Telephone Encounter (Signed)
Discussed lab results with patient as requested.  Per patient she does not think that she can quit smoking for 4 - 6 weeks.  Stated "I've tried but I just can't do it.  I've got too much stress at home right now and it's just not going to happen."

## 2012-06-26 ENCOUNTER — Encounter (HOSPITAL_BASED_OUTPATIENT_CLINIC_OR_DEPARTMENT_OTHER): Payer: PRIVATE HEALTH INSURANCE

## 2012-06-26 DIAGNOSIS — D72829 Elevated white blood cell count, unspecified: Secondary | ICD-10-CM

## 2012-06-26 NOTE — Progress Notes (Signed)
Labs drawn today for Bcr/abl 

## 2012-06-29 LAB — BCR/ABL GENE REARRANGEMENT QNT, PCR
BCR ABL1 / ABL1 IS: 0 not reported
BCR ABL1 / ABL1: 0 not reported
Prior result - BCRQ: 0

## 2012-06-29 LAB — P190 BCR-ABL 1: P190 BCR ABL1: NOT DETECTED

## 2012-07-23 ENCOUNTER — Ambulatory Visit (HOSPITAL_COMMUNITY): Payer: PRIVATE HEALTH INSURANCE | Admitting: Oncology

## 2012-07-29 ENCOUNTER — Ambulatory Visit (HOSPITAL_COMMUNITY): Payer: PRIVATE HEALTH INSURANCE | Admitting: Oncology

## 2012-12-24 ENCOUNTER — Other Ambulatory Visit: Payer: Self-pay | Admitting: Obstetrics & Gynecology

## 2012-12-24 MED ORDER — NAPROXEN SODIUM 550 MG PO TABS: 550.0000 mg | ORAL_TABLET | Freq: Two times a day (BID) | ORAL | Status: DC

## 2013-01-16 ENCOUNTER — Other Ambulatory Visit: Payer: Self-pay | Admitting: Family Medicine

## 2013-01-16 ENCOUNTER — Encounter: Payer: Self-pay | Admitting: Family Medicine

## 2013-01-16 ENCOUNTER — Ambulatory Visit (INDEPENDENT_AMBULATORY_CARE_PROVIDER_SITE_OTHER): Payer: BC Managed Care – PPO | Admitting: Family Medicine

## 2013-01-16 VITALS — BP 110/78 | HR 94 | Temp 98.8°F | Resp 18 | Wt 263.0 lb

## 2013-01-16 DIAGNOSIS — Z0184 Encounter for antibody response examination: Secondary | ICD-10-CM

## 2013-01-16 DIAGNOSIS — I10 Essential (primary) hypertension: Secondary | ICD-10-CM

## 2013-01-16 DIAGNOSIS — M25539 Pain in unspecified wrist: Secondary | ICD-10-CM

## 2013-01-16 DIAGNOSIS — M25531 Pain in right wrist: Secondary | ICD-10-CM

## 2013-01-16 DIAGNOSIS — E049 Nontoxic goiter, unspecified: Secondary | ICD-10-CM

## 2013-01-16 DIAGNOSIS — E785 Hyperlipidemia, unspecified: Secondary | ICD-10-CM

## 2013-01-16 DIAGNOSIS — E559 Vitamin D deficiency, unspecified: Secondary | ICD-10-CM

## 2013-01-16 DIAGNOSIS — G8929 Other chronic pain: Secondary | ICD-10-CM

## 2013-01-16 DIAGNOSIS — Z09 Encounter for follow-up examination after completed treatment for conditions other than malignant neoplasm: Secondary | ICD-10-CM

## 2013-01-16 MED ORDER — MELOXICAM 15 MG PO TABS: 15.0000 mg | ORAL_TABLET | Freq: Every day | ORAL | Status: DC

## 2013-01-16 NOTE — Progress Notes (Signed)
Subjective:    Patient ID: Theresa Castillo, female    DOB: Jan 24, 1966, 47 y.o.   MRN: [id number]  HPI Patient reports pain in her right first [facility] joint x1 year. It hurts to lift her laptop. Her surgery carried heavy objects. She also describes dysesthesias in her right wrist that radiates up into her right forearm.  She has tried Naprosyn with minimal relief. She denies any injury in the joint. She reports crepitus with movement in the [facility] and MCP joints of the first digit. Past Medical History  Diagnosis Date  . Allergy     pineapple abd pain and diarrhea  . Anxiety   . Depression   . Thyroid disease     borderline hypo  . Ulcer     born with stomach ulcer  . Hyperlipidemia   . Seizures     in result of topamax   Current Outpatient Prescriptions on File Prior to Visit  Medication Sig Dispense Refill  . ALPRAZolam (XANAX) 0.5 MG tablet Take 1 mg by mouth as needed. anxiety      . amphetamine-dextroamphetamine (ADDERALL) 20 MG tablet Take 20 mg by mouth daily. Takes 1 in the am and 1 at noon(sometimes forgets noon dosage.      Marland Kitchen CRANBERRY PO Take 4,200 mg by mouth daily.        . folic acid (FOLVITE) 1 MG tablet Take 2 mg by mouth daily.        . Lactobacillus (ACIDOPHILUS PO) Take by mouth daily.        Marland Kitchen levothyroxine (SYNTHROID, LEVOTHROID) 25 MCG tablet Take 25 mcg by mouth daily.        Marland Kitchen loratadine (CLARITIN) 10 MG tablet Take 10 mg by mouth daily.      . Multiple Vitamin (MULTIVITAMIN PO) Take 1 tablet by mouth daily.        Marland Kitchen OLANZapine (ZYPREXA) 5 MG tablet Take 5 mg by mouth every morning.        . venlafaxine (EFFEXOR-XR) 150 MG 24 hr capsule Take 150 mg by mouth 2 (two) times daily.        Marland Kitchen zolpidem (AMBIEN) 10 MG tablet Take 10 mg by mouth at bedtime as needed.        . naproxen sodium (ANAPROX DS) 550 MG tablet Take 1 tablet (550 mg total) by mouth 2 (two) times daily with a meal.  60 tablet  1   No current facility-administered medications on file prior to  visit.      Review of Systems  All other systems reviewed and are negative.       Objective:   Physical Exam  Constitutional: She appears well-developed and well-nourished.  Cardiovascular: Normal rate, regular rhythm and normal heart sounds.   Pulmonary/Chest: Effort normal and breath sounds normal.  Musculoskeletal: Normal range of motion. She exhibits tenderness. She exhibits no edema.   patient has tenderness to palpation in the right anatomic snuffbox, the right CMC joint, and the right hip and CP joint of the first digit.  She also has a positive Tinel sign in the right wrist.        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 Begin the patient on Mobic 15 mg by mouth daily.  Recheck in one week. If x-rays are negative for bony pathology and Mobic does not help her pain, consider evaluation of carpal tunnel syndrome as a  cause of the pain in her right thumb. If Mobic does not help, and the pain is due to osteoarthritis, consider orthopedic consult for cortisone injection.

## 2013-01-20 ENCOUNTER — Ambulatory Visit
Admission: RE | Admit: 2013-01-20 | Discharge: 2013-01-20 | Disposition: A | Discharge status: Home / Self Care | Payer: BC Managed Care – PPO | Source: Ambulatory Visit | Attending: Family Medicine | Admitting: Family Medicine

## 2013-01-20 ENCOUNTER — Other Ambulatory Visit (INDEPENDENT_AMBULATORY_CARE_PROVIDER_SITE_OTHER): Payer: BC Managed Care – PPO

## 2013-01-20 DIAGNOSIS — E559 Vitamin D deficiency, unspecified: Secondary | ICD-10-CM

## 2013-01-20 DIAGNOSIS — Z0000 Encounter for general adult medical examination without abnormal findings: Secondary | ICD-10-CM

## 2013-01-20 DIAGNOSIS — E785 Hyperlipidemia, unspecified: Secondary | ICD-10-CM

## 2013-01-20 DIAGNOSIS — Z09 Encounter for follow-up examination after completed treatment for conditions other than malignant neoplasm: Secondary | ICD-10-CM

## 2013-01-20 DIAGNOSIS — Z0184 Encounter for antibody response examination: Secondary | ICD-10-CM

## 2013-01-20 DIAGNOSIS — I10 Essential (primary) hypertension: Secondary | ICD-10-CM

## 2013-01-20 DIAGNOSIS — E049 Nontoxic goiter, unspecified: Secondary | ICD-10-CM

## 2013-01-20 DIAGNOSIS — G8929 Other chronic pain: Secondary | ICD-10-CM

## 2013-01-20 LAB — LIPID PANEL
HDL: 40 mg/dL (ref >39)
LDL Cholesterol: 126 mg/dL — ABNORMAL HIGH (ref 0–99)
Triglycerides: 229 mg/dL — ABNORMAL HIGH (ref <150)

## 2013-01-20 LAB — COMPREHENSIVE METABOLIC PANEL
Albumin: 4.1 g/dL (ref 3.5–5.2)
Alkaline Phosphatase: 107 U/L (ref 39–117)
BUN: 9 mg/dL (ref 6–23)
Creat: 0.81 mg/dL (ref 0.50–1.10)
Glucose, Bld: 93 mg/dL (ref 70–99)
Potassium: 4.2 mEq/L (ref 3.5–5.3)

## 2013-01-21 ENCOUNTER — Encounter: Payer: Self-pay | Admitting: Family Medicine

## 2013-01-21 LAB — MEASLES/MUMPS/RUBELLA IMMUNITY
Mumps IgG: 7.65 AU/mL (ref <9.00)
Rubeola IgG: 83.8 AU/mL — ABNORMAL HIGH (ref <25.00)

## 2013-01-21 LAB — HEPATITIS B SURFACE ANTIBODY, QUANTITATIVE: Hepatitis B-Post: 0.5 m[IU]/mL

## 2013-01-27 ENCOUNTER — Ambulatory Visit (HOSPITAL_COMMUNITY): Payer: PRIVATE HEALTH INSURANCE | Admitting: Oncology

## 2013-02-04 ENCOUNTER — Encounter: Payer: Self-pay | Admitting: Family Medicine

## 2013-02-04 ENCOUNTER — Ambulatory Visit (INDEPENDENT_AMBULATORY_CARE_PROVIDER_SITE_OTHER): Payer: BC Managed Care – PPO | Admitting: Family Medicine

## 2013-02-04 VITALS — BP 126/74 | HR 84 | Temp 98.3°F | Resp 18 | Wt 265.0 lb

## 2013-02-04 DIAGNOSIS — Z23 Encounter for immunization: Secondary | ICD-10-CM

## 2013-02-04 DIAGNOSIS — M654 Radial styloid tenosynovitis [de Quervain]: Secondary | ICD-10-CM

## 2013-02-04 NOTE — Progress Notes (Signed)
Subjective:    Patient ID: Theresa Castillo, female    DOB: 1965/11/01, 47 y.o.   MRN: [id number]  HPI 01/16/13 Patient reports pain in her right first [facility] joint x1 year. It hurts to lift her laptop. Her surgery carried heavy objects. She also describes dysesthesias in her right wrist that radiates up into her right forearm.  She has tried Naprosyn with minimal relief. She denies any injury in the joint. She reports crepitus with movement in the [facility] and MCP joints of the first digit.  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  Obtain x-rays of the right wrist and thumb. Begin the patient on Mobic 15 mg by mouth daily.   02/04/13 Patient states the pain in her wrist and from anesthesia percent better. X-rays reveal no bony pathology or evidence of arthritis.  She denies neuropathy or dysesthesias in the hand. She denies any neck pain.   Past Medical History  Diagnosis Date  . Allergy     pineapple abd pain and diarrhea  . Anxiety   . Depression   . Thyroid disease     borderline hypo  . Ulcer     born with stomach ulcer  . Hyperlipidemia   . Seizures     in result of topamax   Current Outpatient Prescriptions on File Prior to Visit  Medication Sig Dispense Refill  . ALPRAZolam (XANAX) 0.5 MG tablet Take 1 mg by mouth as needed. anxiety      . amphetamine-dextroamphetamine (ADDERALL) 20 MG tablet Take 20 mg by mouth daily. Takes 1 in the am and 1 at noon(sometimes forgets noon dosage.      Marland Kitchen buPROPion (WELLBUTRIN) 75 MG tablet Take 75 mg by mouth daily.      Marland Kitchen CRANBERRY PO Take 4,200 mg by mouth daily.        . folic acid (FOLVITE) 1 MG tablet Take 2 mg by mouth daily.        . Lactobacillus (ACIDOPHILUS PO) Take by mouth daily.        Marland Kitchen levothyroxine (SYNTHROID, LEVOTHROID) 25 MCG tablet Take 25 mcg by mouth daily.        Marland Kitchen loratadine (CLARITIN) 10 MG tablet Take 10 mg by mouth daily.      . meloxicam (MOBIC) 15 MG tablet Take 1 tablet (15 mg  total) by mouth daily.  30 tablet  0  . Multiple Vitamin (MULTIVITAMIN PO) Take 1 tablet by mouth daily.        Marland Kitchen OLANZapine (ZYPREXA) 5 MG tablet Take 5 mg by mouth every morning.        . venlafaxine (EFFEXOR-XR) 150 MG 24 hr capsule Take 150 mg by mouth 2 (two) times daily.        Marland Kitchen zolpidem (AMBIEN) 10 MG tablet Take 10 mg by mouth at bedtime as needed.         No current facility-administered medications on file prior to visit.      Review of Systems  All other systems reviewed and are negative.       Objective:   Physical Exam  Constitutional: She appears well-developed and well-nourished.  Cardiovascular: Normal rate, regular rhythm and normal heart sounds.   Pulmonary/Chest: Effort normal and breath sounds normal.  Musculoskeletal: Normal range of motion. She exhibits tenderness. She exhibits no edema.   patient has tenderness to palpation in the right anatomic snuffbox, the right CMC joint, and the right MCP joint of the first  digit.  Today she has a negative Tinel and negative Phalen sign. She also has a negative Spurling maneuver. She has normal reflexes at the biceps and brachioradialis. There is no evidence of a cervical radiculopathy. Muscle strength is 5 over 5 equal and symmetric in the right extremity. She does have a positive Finkelstein sign.        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 If the symptoms do not improve, I will put the patient in a thumb spica splint.  His symptoms did not improve with immobilization, would recommend orthopedics consult for cortisone injection.

## 2013-02-04 NOTE — Addendum Note (Signed)
Addended by: Legrand Rams B on: 02/04/2013 12:20 PM   Modules accepted: Orders

## 2013-02-10 ENCOUNTER — Other Ambulatory Visit: Payer: Self-pay | Admitting: Family Medicine

## 2013-02-10 NOTE — Telephone Encounter (Signed)
Medication refilled per protocol. 

## 2013-02-13 ENCOUNTER — Encounter (HOSPITAL_COMMUNITY): Payer: Self-pay

## 2013-03-16 ENCOUNTER — Emergency Department (HOSPITAL_COMMUNITY)
Admission: EM | Admit: 2013-03-16 | Discharge: 2013-03-16 | Disposition: A | Discharge status: Home / Self Care | Payer: BC Managed Care – PPO | Attending: Emergency Medicine | Admitting: Emergency Medicine

## 2013-03-16 ENCOUNTER — Emergency Department (HOSPITAL_COMMUNITY): Payer: BC Managed Care – PPO

## 2013-03-16 ENCOUNTER — Encounter (HOSPITAL_COMMUNITY): Payer: Self-pay

## 2013-03-16 DIAGNOSIS — E785 Hyperlipidemia, unspecified: Secondary | ICD-10-CM | POA: Insufficient documentation

## 2013-03-16 DIAGNOSIS — Z880 Allergy status to penicillin: Secondary | ICD-10-CM | POA: Insufficient documentation

## 2013-03-16 DIAGNOSIS — Z79899 Other long term (current) drug therapy: Secondary | ICD-10-CM | POA: Insufficient documentation

## 2013-03-16 DIAGNOSIS — R451 Restlessness and agitation: Secondary | ICD-10-CM

## 2013-03-16 DIAGNOSIS — F17200 Nicotine dependence, unspecified, uncomplicated: Secondary | ICD-10-CM | POA: Insufficient documentation

## 2013-03-16 DIAGNOSIS — E079 Disorder of thyroid, unspecified: Secondary | ICD-10-CM | POA: Insufficient documentation

## 2013-03-16 DIAGNOSIS — F3289 Other specified depressive episodes: Secondary | ICD-10-CM | POA: Insufficient documentation

## 2013-03-16 DIAGNOSIS — Z91040 Latex allergy status: Secondary | ICD-10-CM | POA: Insufficient documentation

## 2013-03-16 DIAGNOSIS — Y929 Unspecified place or not applicable: Secondary | ICD-10-CM | POA: Insufficient documentation

## 2013-03-16 DIAGNOSIS — Y9389 Activity, other specified: Secondary | ICD-10-CM | POA: Insufficient documentation

## 2013-03-16 DIAGNOSIS — S62309A Unspecified fracture of unspecified metacarpal bone, initial encounter for closed fracture: Secondary | ICD-10-CM | POA: Insufficient documentation

## 2013-03-16 DIAGNOSIS — IMO0002 Reserved for concepts with insufficient information to code with codable children: Secondary | ICD-10-CM | POA: Insufficient documentation

## 2013-03-16 DIAGNOSIS — Z3202 Encounter for pregnancy test, result negative: Secondary | ICD-10-CM | POA: Insufficient documentation

## 2013-03-16 DIAGNOSIS — G40909 Epilepsy, unspecified, not intractable, without status epilepticus: Secondary | ICD-10-CM | POA: Insufficient documentation

## 2013-03-16 DIAGNOSIS — F411 Generalized anxiety disorder: Secondary | ICD-10-CM | POA: Insufficient documentation

## 2013-03-16 DIAGNOSIS — F329 Major depressive disorder, single episode, unspecified: Secondary | ICD-10-CM | POA: Insufficient documentation

## 2013-03-16 HISTORY — DX: Other chronic pain: G89.29

## 2013-03-16 HISTORY — DX: Pain in right wrist: M25.531

## 2013-03-16 LAB — CBC WITH DIFFERENTIAL/PLATELET
Basophils Relative: 0 % (ref 0–1)
Eosinophils Relative: 2 % (ref 0–5)
Hemoglobin: 15.5 g/dL — ABNORMAL HIGH (ref 12.0–15.0)
Lymphocytes Relative: 24 % (ref 12–46)
MCH: 32.4 pg (ref 26.0–34.0)
Monocytes Absolute: 0.8 10*3/uL (ref 0.1–1.0)
Neutrophils Relative %: 69 % (ref 43–77)
RBC: 4.79 MIL/uL (ref 3.87–5.11)

## 2013-03-16 LAB — ETHANOL: Alcohol, Ethyl (B): <11 mg/dL (ref 0–11)

## 2013-03-16 LAB — URINALYSIS, ROUTINE W REFLEX MICROSCOPIC
Leukocytes, UA: NEGATIVE
Nitrite: NEGATIVE
Specific Gravity, Urine: 1.01 (ref 1.005–1.030)
pH: 5.5 (ref 5.0–8.0)

## 2013-03-16 LAB — RAPID URINE DRUG SCREEN, HOSP PERFORMED
Barbiturates: NOT DETECTED
Cocaine: NOT DETECTED
Opiates: NOT DETECTED

## 2013-03-16 LAB — BASIC METABOLIC PANEL
CO2: 28 mEq/L (ref 19–32)
Chloride: 99 mEq/L (ref 96–112)
Potassium: 3.7 mEq/L (ref 3.5–5.1)
Sodium: 142 mEq/L (ref 135–145)

## 2013-03-16 MED ORDER — OXYCODONE-ACETAMINOPHEN 5-325 MG PO TABS
1.0000 | ORAL_TABLET | Freq: Once | ORAL | Status: AC
  Administered 2013-03-16: 1 via ORAL
  Filled 2013-03-16: qty 1

## 2013-03-16 MED ORDER — HYDROCODONE-ACETAMINOPHEN 5-325 MG PO TABS: ORAL_TABLET | ORAL | Status: DC

## 2013-03-16 MED ORDER — LORAZEPAM 1 MG PO TABS
1.0000 mg | ORAL_TABLET | Freq: Once | ORAL | Status: AC
  Administered 2013-03-16: 1 mg via ORAL
  Filled 2013-03-16: qty 1

## 2013-03-16 NOTE — ED Notes (Signed)
Patient did not fit paper scrubs.  Nurse approved putting patient in large gown.  Patient was taken to bathroom and placed in large gown with NT as witness.

## 2013-03-16 NOTE — ED Notes (Signed)
Pt alert & oriented x4, stable gait. Patient given discharge instructions, paperwork & prescription(s). Patient  instructed to stop at the registration desk to finish any additional paperwork. Patient verbalized understanding. Pt left department w/ no further questions. 

## 2013-03-16 NOTE — ED Provider Notes (Signed)
History     CSN: [id number]  Arrival date & time 03/16/13  1658   First MD Initiated Contact with Patient 03/16/13 1734      Chief Complaint  Patient presents with  . V70.1     HPI Pt was seen at 1750.  Per pt and her family, c/o gradual onset and resolution of one episode of agitation that began today PTA. Pt states she stopped taking her effexor 4 days ago "in preparation for starting a new antidepressant medication."  Pt states she called her PMD Thursday and Friday without response regarding if the new medication was called in to her pharmacy. Pt states she called her PMD today for same, and when she didn't receive a response she states she "lost it" by "smashing and breaking the phone" and "trashing the house."  States she "punched a wall" and now has right hand pain. States after this episode at home her PMD called her back and "said my new prescription was ready at the pharmacy."  Pt states she "just doesn't feel good" since stopping the effexor: feeling agitated, whole body "hurting" and "having electric zaps" as well as episodes of confusion. Endorses depression and vague SI, but no plan.  Denies SA, no HI.     Past Medical History  Diagnosis Date  . Allergy     pineapple abd pain and diarrhea  . Anxiety   . Depression   . Thyroid disease     borderline hypo  . Ulcer     born with stomach ulcer  . Hyperlipidemia   . 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 Chronic pain of right wrist     Past Surgical History  Procedure Laterality Date  . Cholecystectomy  1995  . Salpingoophorectomy  1984    right  . Ankle surgery  2003    right  . Pilonial cyst removed twice    . Tubal ligation    . Oophorectomy    . Uterine fibroid surgery    . 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 Colon cancer Father   . Cancer Mother     breast/lukemia  . Cancer Maternal Aunt     breast and thyroid  . Cancer Paternal Aunt     breast  . Cancer Maternal Grandmother     colon    History  Substance Use Topics  . Smoking status: Current Every Day Smoker -- 1.50 packs/day for 2 years    Types: Cigarettes  . Smokeless tobacco: Never Used  . Alcohol Use: Yes     Comment: rarely      Review of Systems ROS: Statement: All systems negative except as marked or noted in the HPI; Constitutional: Negative for fever and chills. ; ; Eyes: Negative for eye pain, redness and discharge. ; ; ENMT: Negative for ear pain, hoarseness, nasal congestion, sinus pressure and sore throat. ; ; Cardiovascular: Negative for chest pain, palpitations, diaphoresis, dyspnea and peripheral edema. ; ; Respiratory: Negative for cough, wheezing and stridor. ; ; Gastrointestinal: Negative for nausea, vomiting, diarrhea, abdominal pain, blood in stool, hematemesis, jaundice and rectal bleeding. . ; ; Genitourinary: Negative for dysuria, flank pain and hematuria. ; ; Musculoskeletal: Negative  for back pain and neck pain. +right hand pain, swelling and trauma.; ; Skin: Negative for pruritus, rash, abrasions, blisters, bruising and skin lesion.; ; Neuro: Negative for headache, lightheadedness and neck stiffness. Negative for weakness, altered level of consciousness , altered mental status, extremity weakness, paresthesias, involuntary movement, seizure and syncope.; Psych:  +agitation, vague SI. No SA, no HI, no hallucinations.      Allergies  Ammonia; Amoxicillin; Effexor; Pineapple; Topamax; Topamax; Trazodone and nefazodone; and Latex  Home Medications   Current Outpatient Rx  Name  Route  Sig  Dispense  Refill  . ALPRAZolam (XANAX) 1 MG tablet   Oral   Take 1 mg by mouth daily as needed for sleep or anxiety.         Marland Kitchen amphetamine-dextroamphetamine  (ADDERALL) 10 MG tablet   Oral   Take 10 mg by mouth daily as needed.         Marland Kitchen amphetamine-dextroamphetamine (ADDERALL) 20 MG tablet   Oral   Take 20 mg by mouth every morning.          Marland Kitchen buPROPion (WELLBUTRIN SR) 150 MG 12 hr tablet   Oral   Take 150 mg by mouth every morning.         . clobetasol cream (TEMOVATE) 0.05 %   Topical   Apply 1 application topically 2 (two) times daily.         Marland Kitchen CRANBERRY PO   Oral   Take 4,200 mg by mouth daily.           Marland Kitchen doxepin (SINEQUAN) 10 MG capsule   Oral   Take 10-20 mg by mouth at bedtime.         . folic acid (FOLVITE) 1 MG tablet   Oral   Take 2 mg by mouth every morning.          . Lactobacillus (ACIDOPHILUS PO)   Oral   Take 1 capsule by mouth daily.          Marland Kitchen levothyroxine (SYNTHROID, LEVOTHROID) 50 MCG tablet   Oral   Take 50 mcg by mouth every morning.         . loratadine (CLARITIN) 10 MG tablet   Oral   Take 10 mg by mouth daily.         . meloxicam (MOBIC) 15 MG tablet   Oral   Take 15 mg by mouth every morning.         . ranitidine (ZANTAC) 150 MG tablet   Oral   Take 150 mg by mouth 2 (two) times daily as needed for heartburn.         . venlafaxine (EFFEXOR-XR) 150 MG 24 hr capsule   Oral   Take 150 mg by mouth 2 (two) times daily.             BP 143/106  Pulse 108  Temp(Src) 98.5 F (36.9 C) (Oral)  Resp 17  Ht 5' 6.25" (1.683 m)  Wt 265 lb (120.203 kg)  BMI 42.44 kg/m2  SpO2 100%  Physical Exam 1755: Physical examination:  Nursing notes reviewed; Vital signs and O2 SAT reviewed;  Constitutional: Well developed, Well nourished, Well hydrated, In no acute distress; Head:  Normocephalic, atraumatic; Eyes: EOMI, PERRL, No scleral icterus; ENMT: Mouth and pharynx normal, Mucous membranes moist; Neck: Supple, Full range of motion, No lymphadenopathy; Cardiovascular: Regular rate and rhythm, No murmur, rub, or gallop; Respiratory: Breath sounds clear & equal bilaterally, No  rales, rhonchi, wheezes.  Speaking  full sentences with ease, Normal respiratory effort/excursion; Chest: Nontender, Movement normal; Abdomen: Soft, Nontender, Nondistended, Normal bowel sounds; Genitourinary: No CVA tenderness; Extremities: Pulses normal, +right dorsal hand 5th distal metacarpal area tenderness to palp with localized ecchymosis and edema. No open wounds, no erythema. No calf edema or asymmetry.; Neuro: AA&Ox3, Major CN grossly intact.  Speech clear. No gross focal motor or sensory deficits in extremities.; Skin: Color normal, Warm, Dry.; Psych:  Affect flat, poor eye contact. Denies SI.     ED Course  Procedures   1800:  Will place ulnar gutter splint/sling for fx.  Will check labs, UDS, and have Telepsych MD eval.    2215:  Pt has been eval by Telepsych Dr. Jacky Kindle: states pt does not have any plan to harm herself or others, endorses she has an appt with her psych MD tomorrow and plans to take her new meds; he believes there is no criteria for inpt admission at this time and requests pt be d/c home to f/u with her psych MD tomorrow. Dx and testing, as well as d/w Telepsych MD, d/w pt and family.  Questions answered.  Verb understanding, agreeable to d/c home with outpt f/u with her psych MD tomorrow and Ortho MD this week.   MDM  MDM Reviewed: previous chart, nursing note and vitals Interpretation: labs     Results for orders placed during the hospital encounter of 03/16/13  CBC WITH DIFFERENTIAL      Result Value Range   WBC 15.6 (*) 4.0 - 10.5 K/uL   RBC 4.79  3.87 - 5.11 MIL/uL   Hemoglobin 15.5 (*) 12.0 - 15.0 g/dL   HCT 40.9  81.1 - 91.4 %   MCV 95.6  78.0 - 100.0 fL   MCH 32.4  26.0 - 34.0 pg   MCHC 33.8  30.0 - 36.0 g/dL   RDW 78.2  95.6 - 21.3 %   Platelets 307  150 - 400 K/uL   Neutrophils Relative % 69  43 - 77 %   Lymphocytes Relative 24  12 - 46 %   Monocytes Relative 5  3 - 12 %   Eosinophils Relative 2  0 - 5 %   Basophils Relative 0  0 - 1 %    Neutro Abs 10.8 (*) 1.7 - 7.7 K/uL   Lymphs Abs 3.7  0.7 - 4.0 K/uL   Monocytes Absolute 0.8  0.1 - 1.0 K/uL   Eosinophils Absolute 0.3  0.0 - 0.7 K/uL   Basophils Absolute 0.0  0.0 - 0.1 K/uL   WBC Morphology ATYPICAL LYMPHOCYTES     Smear Review LARGE PLATELETS PRESENT    BASIC METABOLIC PANEL      Result Value Range   Sodium 142  135 - 145 mEq/L   Potassium 3.7  3.5 - 5.1 mEq/L   Chloride 99  96 - 112 mEq/L   CO2 28  19 - 32 mEq/L   Glucose, Bld 94  70 - 99 mg/dL   BUN 12  6 - 23 mg/dL   Creatinine, Ser 0.86  0.50 - 1.10 mg/dL   Calcium 9.9  8.4 - 57.8 mg/dL   GFR calc non Af Amer 68 (*) >90 mL/min   GFR calc Af Amer 79 (*) >90 mL/min  ETHANOL      Result Value Range   Alcohol, Ethyl (B) <11  0 - 11 mg/dL  URINALYSIS, ROUTINE W REFLEX MICROSCOPIC      Result Value Range   Color, Urine  YELLOW  YELLOW   APPearance CLEAR  CLEAR   Specific Gravity, Urine 1.010  1.005 - 1.030   pH 5.5  5.0 - 8.0   Glucose, UA NEGATIVE  NEGATIVE mg/dL   Hgb urine dipstick NEGATIVE  NEGATIVE   Bilirubin Urine NEGATIVE  NEGATIVE   Ketones, ur NEGATIVE  NEGATIVE mg/dL   Protein, ur NEGATIVE  NEGATIVE mg/dL   Urobilinogen, UA 0.2  0.0 - 1.0 mg/dL   Nitrite NEGATIVE  NEGATIVE   Leukocytes, UA NEGATIVE  NEGATIVE  PREGNANCY, URINE      Result Value Range   Preg Test, Ur NEGATIVE  NEGATIVE  URINE RAPID DRUG SCREEN (HOSP PERFORMED)      Result Value Range   Opiates NONE DETECTED  NONE DETECTED   Cocaine NONE DETECTED  NONE DETECTED   Benzodiazepines NONE DETECTED  NONE DETECTED   Amphetamines POSITIVE (*) NONE DETECTED   Tetrahydrocannabinol POSITIVE (*) NONE DETECTED   Barbiturates NONE DETECTED  NONE DETECTED   Dg Hand Complete Right 03/16/2013   *RADIOLOGY REPORT*  Clinical Data: Punched a wall 1 hour ago, right hand pain, bruising and swelling  RIGHT HAND - COMPLETE 3+ VIEW  Comparison: Right wrist radiographs 01/20/2013  Findings: Osseous mineralization normal. Distal fifth metacarpal fracture  with minimal displacement and apex dorsal angulation. Fracture extends to near the articular margin without definite intra-articular extension. Joint spaces preserved. No additional fracture, dislocation, or bone destruction.  IMPRESSION: Mildly displaced and angulated distal right fifth metacarpal fracture.   Original Report Authenticated By: Ulyses Southward, M.D.           Laray Anger, DO 03/19/13 [phone]

## 2013-03-16 NOTE — ED Notes (Signed)
Pt reports stopped taking effexor Thursday because insurance won't pay for it anymore.  Pt c/o headache, husband says pt "lost it" at home, "trashed the house."  Pt punched the wall at home with r hand.  Pt has swelling noted.  Pt sobbing in triage.  Pt reports SI, denies plan.  Husband says she was "fine" on the effexor.

## 2013-03-16 NOTE — ED Notes (Signed)
Patient placed in paper scrubs.  Patient scraped R hand on overhead light, screamed out in pain and flung herself onto the bed quite dramatically.  She is diaphoretic, but temp is 98.7 orally.  Very irritable, stating her whole body has been hurting and now especially her head.  Husband relates she has been very angry and agitated since Friday - has not had Effexor since Thursday as insurance will no longer pay for brand name.  Patient states generic does not work for her.  They do not have funds to pay out of pocket for brand name.

## 2013-03-20 ENCOUNTER — Other Ambulatory Visit: Payer: Self-pay | Admitting: Family Medicine

## 2013-04-08 ENCOUNTER — Encounter: Payer: Self-pay | Admitting: Obstetrics and Gynecology

## 2013-04-08 ENCOUNTER — Ambulatory Visit (INDEPENDENT_AMBULATORY_CARE_PROVIDER_SITE_OTHER): Payer: BC Managed Care – PPO | Admitting: Obstetrics and Gynecology

## 2013-04-08 ENCOUNTER — Other Ambulatory Visit (HOSPITAL_COMMUNITY)
Admission: RE | Admit: 2013-04-08 | Discharge: 2013-04-08 | Disposition: A | Discharge status: Home / Self Care | Payer: BC Managed Care – PPO | Source: Ambulatory Visit | Attending: Obstetrics and Gynecology | Admitting: Obstetrics and Gynecology

## 2013-04-08 VITALS — BP 138/86 | Ht 67.0 in | Wt 253.8 lb

## 2013-04-08 DIAGNOSIS — Z01419 Encounter for gynecological examination (general) (routine) without abnormal findings: Secondary | ICD-10-CM | POA: Insufficient documentation

## 2013-04-08 DIAGNOSIS — Z1151 Encounter for screening for human papillomavirus (HPV): Secondary | ICD-10-CM | POA: Insufficient documentation

## 2013-04-08 DIAGNOSIS — R87810 Cervical high risk human papillomavirus (HPV) DNA test positive: Secondary | ICD-10-CM | POA: Insufficient documentation

## 2013-04-08 DIAGNOSIS — Z1212 Encounter for screening for malignant neoplasm of rectum: Secondary | ICD-10-CM

## 2013-04-08 NOTE — Progress Notes (Signed)
Patient ID: Theresa Castillo, female   DOB: May 04, 1966, 47 y.o.   MRN: [id number] Here today for annual pap and physical. Pt denies any female issues or problems at this time. Pt has had an abnormal pap in the past. Post for HPV in 2013  Assessment:  Normal Gyn Exam   Plan:  1. Mammogram 2. pap smear done, next pap due  or as per results todays pap 3. Return annually or prn  Subjective:  Theresa Castillo is a 47 y.o. female No obstetric history on file. who presents for annual exam.  The patient has complaints today of repeat pap needed.   The following portions of the patient's history were reviewed and updated as appropriate: allergies, current medications, past family history, past medical history, past social history, past surgical history and problem list.  Review of Systems Pertinent items are noted in HPI.  Objective:  BP 138/86  Ht 5\' 7"  (1.702 m)  Wt 253 lb 12.8 oz (115.123 kg)  BMI 39.74 kg/m2  BMI: Body mass index is 39.74 kg/(m^2). General Appearance: Alert, appropriate appearance for age. No acute distress HEENT: Grossly normal Neck / Thyroid:  Cardiovascular: RRR; normal S1, S2, no murmur Lungs: CTA bilaterally Back: No CVAT Breast Exam: No masses or nodes.No dimpling, nipple retraction or discharge. Gastrointestinal: Soft, non-tender, no masses or organomegaly Pelvic Exam: Vulva and vagina appear normal. Bimanual exam reveals normal uterus and adnexa. Rectovaginal: not indicated and guaiac negative stool obtained Lymphatic Exam: Non-palpable nodes in neck, clavicular, axillary, or inguinal regions Skin: no rash or abnormalities Neurologic: Normal gait and speech, no tremor  Psychiatric: Alert and oriented, appropriate affect.  Urinalysis:Not done  Christin Bach. MD Pgr [phone] 12:48 PM

## 2013-04-08 NOTE — Patient Instructions (Addendum)
HPV Test The HPV (human papillomavirus) test is used to screen for high-risk types with HPV infection. HPV is a group of about 100 related viruses, of which 40 types are genital viruses. Most HPV viruses cause infections that usually resolve without treatment within 2 years. Some HPV infections can cause skin and genital warts (condylomata). HPV types 16, 18, 31 and 45 are considered high-risk types of HPV. High-risk types of HPV do not usually cause visible warts, but if untreated, may lead to cancers of the outlet of the womb (cervix) or anus. An HPV test identifies the DNA (genetic) strands of the HPV infection. Because the test identifies the DNA strands, the test is also referred to as the HPV DNA test. Although HPV is found in both males and females, the HPV test is only used to screen for cervical cancer in females. This test is recommended for females:  With an abnormal Pap test.  After treatment of an abnormal Pap test.  Aged 30 and older.  After treatment of a high-risk HPV infection. The HPV test may be done at the same time as a Pap test in females over the age of 30. Both the HPV and Pap test require a sample of cells from the cervix. PREPARATION FOR TEST  You may be asked to avoid douching, tampons, or vaginal medicines for 48 hours before the HPV test. You will be asked to urinate before the test. For the HPV test, you will need to lie on an exam table with your feet in stirrups. A spatula will be inserted into the vagina. The spatula will be used to swab the cervix for a cell and mucus sample. The sample will be further evaluated in a lab under a microscope. NORMAL FINDINGS  Normal: High-risk HPV is not found.  Ranges for normal findings may vary among different laboratories and hospitals. You should always check with your doctor after having lab work or other tests done to discuss the meaning of your test results and whether your values are considered within normal limits. MEANING  OF TEST An abnormal HPV test means that high-risk HPV is found. Your caregiver may recommend further testing. Your caregiver will go over the test results with you. He or she will and discuss the importance and meaning of your results, as well as treatment options and the need for additional tests, if necessary. OBTAINING THE RESULTS  It is your responsibility to obtain your test results. Ask the lab or department performing the test when and how you will get your results. Document Released: 10/19/2004 Document Revised: 12/17/2011 Document Reviewed: 07/04/2005 [employer] Patient Information 2014 [employer].  

## 2013-04-27 ENCOUNTER — Telehealth: Payer: Self-pay | Admitting: Obstetrics and Gynecology

## 2013-04-28 ENCOUNTER — Telehealth: Payer: Self-pay | Admitting: Adult Health

## 2013-04-28 NOTE — Telephone Encounter (Signed)
Pt came by the office to speak to C. 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

## 2013-04-28 NOTE — Telephone Encounter (Signed)
Pt came into the office this am stating no one had returned her call from yesterday and wanted to speak with someone concerning her pap results. After looking back in the chart a message was left for pt to return call on yesterday, pt informed of this. Pt states had pap 04/08/2013 and has not heard back from the results. Pt informed of abnormal results of Pap from 04/08/2013 including positive HPV. Pt continued to voice her concerns that no one had contacted her with these results. Cyril Mourning, NP was asked to discuss pt concerns. Pt past history of abnormal pap and + HPV and previous colposcopy results was discussed in detail and pt given opportunity to asked questions and concerns. Pt encouraged to make an appt for colposcopy with ECC as the next step to f/u with abnormal pap. Dr. Despina Hidden was informed of pap results and agreed with plan. Pt verbalized understanding and was escorted to check out counter to make an appt.

## 2013-04-29 ENCOUNTER — Ambulatory Visit (INDEPENDENT_AMBULATORY_CARE_PROVIDER_SITE_OTHER): Payer: BC Managed Care – PPO | Admitting: Obstetrics & Gynecology

## 2013-04-29 ENCOUNTER — Encounter: Payer: Self-pay | Admitting: Obstetrics & Gynecology

## 2013-04-29 VITALS — BP 118/88 | Ht 67.0 in | Wt 248.0 lb

## 2013-04-29 DIAGNOSIS — R87810 Cervical high risk human papillomavirus (HPV) DNA test positive: Secondary | ICD-10-CM

## 2013-04-29 DIAGNOSIS — R87619 Unspecified abnormal cytological findings in specimens from cervix uteri: Secondary | ICD-10-CM

## 2013-04-29 DIAGNOSIS — IMO0002 Reserved for concepts with insufficient information to code with codable children: Secondary | ICD-10-CM | POA: Insufficient documentation

## 2013-04-29 NOTE — Patient Instructions (Signed)
Endocervical Curettage The cervix is the neck or lower part of the uterus. It protrudes into the top of the vagina. The front or outer surface of the cervix has squamous cells. The canal (endocervix) opens into the uterus and has columnar cells and glands. Different conditions can cause changes in these cells.  An ECC is done to get a tissue sample from the endocervical canal to look for abnormal cells. This procedure is sometimes done as part of an exam called a colposcopy, in which your caregiver takes a close look at the surface of your cervix because of an abnormal Pap test result, the presence of genital warts, bleeding or pain. This procedure can also be done when doing a D & C. Or, you might have chosen endocervical curettage to find out more about abnormal results of a previous colposcopy.  LET YOUR CAREGIVER KNOW ABOUT:  If you have a recent vaginal infection.  If you have a menstrual period or are bleeding.  If you are allergic or had a reaction to anesthetics.  If you are allergic to any medications.  If you are taking medications. This includes herbs, over-the-counter drugs, eye drops, creams and steroids.  If there is a possibility of being pregnant.  If you have had any past problems with your cervix. RISKS AND COMPLICATIONS   Excessive bleeding.  Infection.  Injury to surrounding organs.  Allergic reaction to anesthetics or medications. BEFORE THE PROCEDURE  Do not take aspirin or blood thinners a week before the procedure. These can cause bleeding.  Do not douche or use tampons for at least 3 days before the procedure.  Do not have sexual intercourse at least three days before the procedure.  Arrive at the office or clinic one hour before the procedure to read and sign any forms and consents. PROCEDURE   For this procedure you will be asked to undress from the waist down. You will need to lie on an exam table with your feet in stirrups. Your legs and belly will  be covered with a sheet.  A speculum will be used to open the walls of your vagina  A drug that numbs the area (local anesthetic) may be used.  A sharp curved-like instrument will scrape cells from the canal, cervix or endocervix.  Medicines may put on the surface of the tissue to stop any bleeding.  The scraped tissue sample is sent to the lab (separate from the biopsy that was taken from the front of the cervix) to see if there are any abnormalities. AFTER THE PROCEDURE   You may have some mild cramping pain.  You will rest in the office or clinic until you are stable and feel ready to go home.  Have someone take you home.  You may go back to your usual diet.  You should be able to go to work the next day. HOME CARE INSTRUCTIONS   Take medications and follow your caregiver's instructions.  Do not take aspirin because it can cause bleeding.  Do not drive until the next day.  Do not douche, use tampons or have sexual intercourse until your caregiver says it is OK to do so.  Take your temperature twice a day. If it is 100 F (37.8 C) or higher, call your caregiver.  A small amount of bloody spotting is normal and will go away in a couple of days. SEEK MEDICAL CARE IF:   You need stronger medication to control your pain.  You develop abnormal, heavy  or bad smelling vaginal discharge.  You get a rash. SEEK IMMEDIATE MEDICAL CARE IF:   Bleeding is heavy like a menstrual period.  You develop a temperature over 102 F (38.9 C) or higher, especially if you also have chills.  You become lightheaded, weak or faint.  You develop shortness of breath. Document Released: 07/03/2008 Document Revised: 12/17/2011 Document Reviewed: 07/03/2008 [facility] Patient Information 2014 [place], [place].

## 2013-04-29 NOTE — Progress Notes (Signed)
Patient ID: Theresa Castillo, female   DOB: 1966/05/25, 47 y.o.   MRN: [id number] Pap AGUS favor dysplasia favor endocervical  ECC performed along with colposcopy Normal colpo ECC sent to path Will call with follow up

## 2013-04-29 NOTE — Addendum Note (Signed)
Addended by: Colen Darling on: 04/29/2013 03:28 PM   Modules accepted: Orders

## 2013-05-07 ENCOUNTER — Ambulatory Visit (INDEPENDENT_AMBULATORY_CARE_PROVIDER_SITE_OTHER): Payer: BC Managed Care – PPO | Admitting: Physician Assistant

## 2013-05-07 ENCOUNTER — Telehealth: Payer: Self-pay | Admitting: Obstetrics & Gynecology

## 2013-05-07 ENCOUNTER — Encounter: Payer: Self-pay | Admitting: Physician Assistant

## 2013-05-07 VITALS — BP 134/84 | HR 72 | Temp 97.8°F | Resp 18 | Ht 65.75 in | Wt 246.0 lb

## 2013-05-07 DIAGNOSIS — R102 Pelvic and perineal pain: Secondary | ICD-10-CM

## 2013-05-07 DIAGNOSIS — N760 Acute vaginitis: Secondary | ICD-10-CM

## 2013-05-07 MED ORDER — TRAMADOL HCL 50 MG PO TABS: 50.0000 mg | ORAL_TABLET | Freq: Three times a day (TID) | ORAL | Status: DC | PRN

## 2013-05-07 NOTE — Telephone Encounter (Signed)
Pt came into the office and wanted to be seen, but the pt was advised the Dr. Despina Hidden was not here and that she would have to go to Urgent care.

## 2013-05-08 NOTE — Progress Notes (Signed)
Patient ID: Theresa Castillo MRN: [id number], DOB: 18-Apr-1966, 47 y.o. Date of Encounter: 05/08/2013, 7:04 AM    Chief Complaint:  Chief Complaint  Patient presents with  . c/o vaginal pain    had  GYN procedure last week  they were unable to see her     HPI: 47 y.o. year old white female reports that she had another abnormal pap smear so Gyn did a biopsy and "ECC" 8 days ago. From what she describes, sounds like endometrial biopsy. Says that brochure she was given says to expect some discomfort for 3 days. She is still experiencing discomfort. Says left lower abdomen/pelvic area is sore. Vaginal canal feels sore. Says she called the Gyn office but they said the earliest they could see her is next week and for her to go to ER or U/C if NTBS before then.  She has had no bleeding, no vaginal discharge, no fever/chills.   Home Meds: See attached medication section for any medications that were entered at today's visit. The computer does not put those onto this list.The following list is a list of meds entered prior to today's visit.   Current Outpatient Prescriptions on File Prior to Visit  Medication Sig Dispense Refill  . ALPRAZolam (XANAX) 1 MG tablet Take 1 mg by mouth daily as needed for sleep or anxiety.      Marland Kitchen amphetamine-dextroamphetamine (ADDERALL) 10 MG tablet Take 10 mg by mouth daily as needed.      Marland Kitchen amphetamine-dextroamphetamine (ADDERALL) 20 MG tablet Take 20 mg by mouth every morning.       Marland Kitchen buPROPion (WELLBUTRIN SR) 150 MG 12 hr tablet Take 150 mg by mouth every morning.      . clobetasol cream (TEMOVATE) 0.05 % Apply 1 application topically 2 (two) times daily.      Marland Kitchen CRANBERRY PO Take 4,200 mg by mouth daily.        Marland Kitchen desvenlafaxine (PRISTIQ) 50 MG 24 hr tablet Take 50 mg by mouth daily.      Marland Kitchen doxepin (SINEQUAN) 10 MG capsule Take 10-20 mg by mouth at bedtime.      . folic acid (FOLVITE) 1 MG tablet Take 2 mg by mouth every morning.       Marland Kitchen HYDROcodone-acetaminophen  (NORCO/VICODIN) 5-325 MG per tablet 1 or 2 tabs PO q6 hours prn pain  20 tablet  0  . Lactobacillus (ACIDOPHILUS PO) Take 1 capsule by mouth daily.       Marland Kitchen levothyroxine (SYNTHROID, LEVOTHROID) 50 MCG tablet Take 50 mcg by mouth every morning.      . loratadine (CLARITIN) 10 MG tablet Take 10 mg by mouth daily.      . meloxicam (MOBIC) 15 MG tablet Take 15 mg by mouth every morning.      . ranitidine (ZANTAC) 150 MG tablet Take 150 mg by mouth 2 (two) times daily as needed for heartburn.       No current facility-administered medications on file prior to visit.    Allergies:  Allergies  Allergen Reactions  . Ammonia Swelling    Throat swells shut  . Amoxicillin Other (See Comments)    Passing out  . Pineapple Hives  . Topamax     Caused seizures   . Topamax (Topiramate)   . Trazodone And Nefazodone   . Latex Rash and Other (See Comments)    Cracking of skin      Review of Systems: See HPI for pertinent ROS. All other  ROS negative.    Physical Exam: Blood pressure 134/84, pulse 72, temperature 97.8 F (36.6 C), temperature source Oral, resp. rate 18, height 5' 5.75" (1.67 m), weight 246 lb (111.585 kg)., Body mass index is 40.01 kg/(m^2). General: Obese WF. Appears in no acute distress. Lungs: Clear bilaterally to auscultation without wheezes, rales, or rhonchi. Breathing is unlabored. Heart: Regular rhythm. No murmurs, rubs, or gallops. Abdomen: Soft, non-tender, non-distended with normoactive bowel sounds. No hepatomegaly. No rebound/guarding. No obvious abdominal masses. Msk:  Strength and tone normal for age. Extremities/Skin: Warm and dry. No edema. Neuro: Alert and oriented X 3. Moves all extremities spontaneously. Gait is normal. CNII-XII grossly in tact. Psych:  Responds to questions appropriately with a normal affect.     ASSESSMENT AND PLAN:  47 y.o. year old female with  1. Pelvic pain - traMADol (ULTRAM) 50 MG tablet; Take 1 tablet (50 mg total) by mouth  every 8 (eight) hours as needed for pain.  Dispense: 30 tablet; Refill: 0 Needs f/u with Gyn. Has appt there in one week. If develops new or worsening symptoms, f/u sooner.  Signed, [address] [place], [place], [facility] 05/08/2013 7:04 AM

## 2013-05-13 ENCOUNTER — Encounter: Payer: Self-pay | Admitting: Obstetrics & Gynecology

## 2013-05-13 ENCOUNTER — Ambulatory Visit (INDEPENDENT_AMBULATORY_CARE_PROVIDER_SITE_OTHER): Payer: BC Managed Care – PPO | Admitting: Obstetrics & Gynecology

## 2013-05-13 VITALS — BP 120/80 | Wt 249.0 lb

## 2013-05-13 DIAGNOSIS — IMO0002 Reserved for concepts with insufficient information to code with codable children: Secondary | ICD-10-CM

## 2013-05-13 DIAGNOSIS — R87619 Unspecified abnormal cytological findings in specimens from cervix uteri: Secondary | ICD-10-CM

## 2013-05-13 NOTE — Progress Notes (Signed)
Patient ID: Theresa Castillo, female   DOB: October 07, 1966, 47 y.o.   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

## 2013-06-05 ENCOUNTER — Other Ambulatory Visit: Payer: Self-pay | Admitting: Obstetrics & Gynecology

## 2013-06-17 ENCOUNTER — Encounter (HOSPITAL_COMMUNITY): Payer: Self-pay | Admitting: *Deleted

## 2013-06-17 ENCOUNTER — Emergency Department (HOSPITAL_COMMUNITY)
Admission: EM | Admit: 2013-06-17 | Discharge: 2013-06-17 | Disposition: A | Discharge status: Home / Self Care | Payer: BC Managed Care – PPO | Attending: Emergency Medicine | Admitting: Emergency Medicine

## 2013-06-17 DIAGNOSIS — Z79899 Other long term (current) drug therapy: Secondary | ICD-10-CM | POA: Insufficient documentation

## 2013-06-17 DIAGNOSIS — F411 Generalized anxiety disorder: Secondary | ICD-10-CM | POA: Insufficient documentation

## 2013-06-17 DIAGNOSIS — F3289 Other specified depressive episodes: Secondary | ICD-10-CM | POA: Insufficient documentation

## 2013-06-17 DIAGNOSIS — F329 Major depressive disorder, single episode, unspecified: Secondary | ICD-10-CM | POA: Insufficient documentation

## 2013-06-17 DIAGNOSIS — E079 Disorder of thyroid, unspecified: Secondary | ICD-10-CM | POA: Insufficient documentation

## 2013-06-17 DIAGNOSIS — F17200 Nicotine dependence, unspecified, uncomplicated: Secondary | ICD-10-CM | POA: Insufficient documentation

## 2013-06-17 DIAGNOSIS — Z8719 Personal history of other diseases of the digestive system: Secondary | ICD-10-CM | POA: Insufficient documentation

## 2013-06-17 DIAGNOSIS — R05 Cough: Secondary | ICD-10-CM | POA: Insufficient documentation

## 2013-06-17 DIAGNOSIS — F29 Unspecified psychosis not due to a substance or known physiological condition: Secondary | ICD-10-CM | POA: Insufficient documentation

## 2013-06-17 DIAGNOSIS — T43605A Adverse effect of unspecified psychostimulants, initial encounter: Secondary | ICD-10-CM | POA: Insufficient documentation

## 2013-06-17 DIAGNOSIS — Z91040 Latex allergy status: Secondary | ICD-10-CM | POA: Insufficient documentation

## 2013-06-17 DIAGNOSIS — G8929 Other chronic pain: Secondary | ICD-10-CM | POA: Insufficient documentation

## 2013-06-17 DIAGNOSIS — R059 Cough, unspecified: Secondary | ICD-10-CM | POA: Insufficient documentation

## 2013-06-17 NOTE — ED Notes (Signed)
Patient's husband reports that the patient was instructed to increase Aderall to 30 mg daily if needed instead of 20 mg. Patient's husband states that the patient took Xanax 1 mg 2 tabs and tramadol 50 mg. Patient states that she feels jittery, foggy, and lethargic. Patient able to answer questions appropriately. Patient  denies SI/HI.

## 2013-06-17 NOTE — ED Provider Notes (Addendum)
CSN: [id number]     Arrival date & time 06/17/13  1050 History   First MD Initiated Contact with Patient 06/17/13 1115     No chief complaint on file.  (Consider location/radiation/quality/duration/timing/severity/associated sxs/prior Treatment) HPI Comments: Pt increased to 30mg  of adderall on Monday from 20mg  and since that time has been anxious, jittery, and feeling crazy.  She has been taking xanax to help her calm down but was so bad last night that she took 2 xanax and a tramadol and then when her husband woke her up this am she was confused and groggy.  They spoke with their c counselor and psychologist but they were unable to see her this morning. She states she was supposed to be at school but could not even get out of bed to go. She has not taken any of her medications today. She denies any suicidal or homicidal ideation  The history is provided by the patient and the spouse.    Past Medical History  Diagnosis Date  . Allergy     pineapple abd pain and diarrhea  . Anxiety   . Depression   . Thyroid disease     borderline hypo  . Ulcer     born with stomach ulcer  . Hyperlipidemia   . 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 Chronic pain of right wrist   . Abnormal Pap smear    Past Surgical History  Procedure Laterality Date  . Cholecystectomy  1995  . Salpingoophorectomy  1984    right  . Ankle surgery  2003    right  . Pilonial cyst removed twice    . Tubal ligation    . Oophorectomy    . Uterine fibroid surgery    . 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 Gum surgery     Family History  Problem Relation Age of Onset  . Colon cancer Father   . Heart disease Father   . Heart attack Father   . Cancer Mother     breast/lukemia  . Cancer Maternal Aunt     breast  and thyroid  . Cancer Paternal Aunt     breast  . Cancer Maternal Grandmother     colon   History  Substance Use Topics  . Smoking status: Current Every Day Smoker -- 1.00 packs/day for 30 years    Types: Cigarettes  . Smokeless tobacco: Never Used     Comment: never done snuff or chewing tobacco.  . Alcohol Use: Yes     Comment: rarely   OB History   Grav Para Term Preterm Abortions TAB SAB Ect Mult Living                 Review of Systems  Respiratory: Positive for cough. Negative for shortness of breath.        Coughing with dark sputum which has been ongoing since going back to smoking cigarettes  Cardiovascular: Negative for chest pain and leg swelling.  Gastrointestinal: Negative for nausea, vomiting, diarrhea, constipation and abdominal distention.  All other systems reviewed and are negative.    Allergies  Ammonia; Amoxicillin; Pineapple; Topamax; Topamax; Trazodone and nefazodone; and Latex  Home Medications   Current Outpatient Rx  Name  Route  Sig  Dispense  Refill  . ALPRAZolam (XANAX) 1 MG tablet   Oral   Take 1 mg by mouth daily as needed for sleep or anxiety.         Marland Kitchen amphetamine-dextroamphetamine (ADDERALL) 10 MG tablet   Oral   Take 10 mg by mouth daily as needed.         Marland Kitchen amphetamine-dextroamphetamine (ADDERALL) 20 MG tablet   Oral   Take 20 mg by mouth every morning.          Marland Kitchen buPROPion (WELLBUTRIN SR) 150 MG 12 hr tablet   Oral   Take 300 mg by mouth every morning.          . clobetasol cream (TEMOVATE) 0.05 %   Topical   Apply 1 application topically 2 (two) times daily.         Marland Kitchen CRANBERRY PO   Oral   Take 4,200 mg by mouth daily.           Marland Kitchen desvenlafaxine (PRISTIQ) 50 MG 24 hr tablet   Oral   Take 50 mg by mouth daily.         Marland Kitchen doxepin (SINEQUAN) 10 MG capsule   Oral   Take 10-20 mg by mouth at bedtime.         Marland Kitchen esomeprazole (NEXIUM) 20 MG capsule   Oral   Take 20 mg by mouth daily before breakfast.          . ESTRACE VAGINAL 0.1 MG/GM vaginal cream      APPLY 2GRAMS AS DIRECTED EVERY OTHER BEDTIME   42.5 g   1   . folic acid (FOLVITE) 1 MG tablet   Oral   Take 2 mg by mouth every morning.          Marland Kitchen HYDROcodone-acetaminophen (NORCO/VICODIN) 5-325 MG per tablet      1 or 2 tabs PO q6 hours prn pain   20 tablet   0   . Lactobacillus (ACIDOPHILUS PO)   Oral   Take 1 capsule by mouth daily.          Marland Kitchen levothyroxine (SYNTHROID, LEVOTHROID) 50 MCG tablet   Oral   Take 50 mcg by mouth every morning.         . loratadine (CLARITIN) 10 MG tablet   Oral   Take 10 mg by mouth daily.         . meloxicam (MOBIC) 15 MG tablet   Oral   Take 15 mg by mouth every morning.         . ranitidine (ZANTAC) 150 MG tablet   Oral   Take 150 mg by mouth 2 (two) times daily as needed for heartburn.         . traMADol (ULTRAM) 50 MG tablet   Oral   Take 1 tablet (50 mg total) by mouth every 8 (eight) hours as needed for pain.   30 tablet   0    BP 128/82  Pulse 70  Temp(Src) 97.8 F (36.6 C) (Oral)  Resp 18  SpO2 93% Physical Exam  Nursing note and vitals reviewed. Constitutional: She is oriented to person, place, and time. She appears well-developed and well-nourished. No distress.  Sleepy on exam but arouses easily and able to answer questions appropriately.  Mild slowed speech but no slurring or aphasia  HENT:  Head: Normocephalic and atraumatic.  Mouth/Throat: Oropharynx is clear and moist.  Eyes: Conjunctivae and EOM are normal. Pupils are equal, round, and reactive to light.  Neck: Normal range of motion. Neck supple.  Cardiovascular: Normal rate, regular rhythm and intact distal pulses.   No murmur heard. Pulmonary/Chest: Effort normal and breath sounds normal. No respiratory distress. She has no wheezes. She has no rales.  Abdominal: Soft. She exhibits no distension. There is no tenderness. There is no rebound and no guarding.  Musculoskeletal: Normal range  of motion. She exhibits no edema and no tenderness.  Neurological: She is alert and oriented to person, place, and time.  Skin: Skin is warm and dry. No rash noted. No erythema.  Psychiatric: Her behavior is normal. Her affect is blunt. She expresses no homicidal and no suicidal ideation.    ED Course  Procedures (including critical care time) Labs Review Labs Reviewed - No data to display Imaging Review No results found.  MDM   1. Adverse reaction to psychostimulant drug, initial encounter     Patient presenting due to an adverse medication reaction. Her Adderall was increased to 30 mg on Monday and she has taken some on Monday and Tuesday which caused jitteriness, anxiousness and made her feel crazy for the last 2 days. Because her symptoms were said that last night she took 2 Xanax and her tramadol around 11 PM and when she woke up this morning around 7 she was incoherent and slightly confused. She now does feel sleepy and has slowed speech but is otherwise mentally appropriate. She has no signs concerning for infectious etiology, stroke or other abnormal brain pathology and feel all of her symptoms are related to medications. Recommended that she go back to her 20 mg Adderall which she's been on for over 6 months and contact take no further Xanax today. Patient's husband is with her in the room and she and patient are agreeable to these changes. They attempted to see the psychologist or counselor today but neither had an appointment available and she missed school so also is requesting a school note.    Gwyneth Sprout, MD 06/17/13 1150  Gwyneth Sprout, MD 06/17/13 1153

## 2013-06-17 NOTE — ED Notes (Signed)
EDP informed writer that the patient is going to take her home meds on her own and then will be discharged.

## 2013-06-17 NOTE — ED Notes (Signed)
Pt c/o feeling disoriented after taking 30 mg of Aderol. Reports feeling jittery and " I can't communicate".

## 2013-06-22 ENCOUNTER — Ambulatory Visit (INDEPENDENT_AMBULATORY_CARE_PROVIDER_SITE_OTHER): Payer: BC Managed Care – PPO | Admitting: Family Medicine

## 2013-06-22 ENCOUNTER — Ambulatory Visit (HOSPITAL_COMMUNITY): Admission: RE | Admit: 2013-06-22 | Payer: BC Managed Care – PPO | Source: Home / Self Care | Admitting: Psychiatry

## 2013-06-22 ENCOUNTER — Encounter: Payer: Self-pay | Admitting: Family Medicine

## 2013-06-22 VITALS — BP 110/80 | HR 82 | Temp 97.8°F | Resp 18 | Wt 235.0 lb

## 2013-06-22 DIAGNOSIS — R4182 Altered mental status, unspecified: Secondary | ICD-10-CM
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
## 2013-06-23 ENCOUNTER — Inpatient Hospital Stay: Payer: BC Managed Care – PPO | Admitting: Family Medicine

## 2013-06-26 ENCOUNTER — Other Ambulatory Visit: Payer: Self-pay | Admitting: *Deleted

## 2013-06-26 ENCOUNTER — Ambulatory Visit
Admission: RE | Admit: 2013-06-26 | Discharge: 2013-06-26 | Disposition: A | Discharge status: Home / Self Care | Payer: BC Managed Care – PPO | Source: Ambulatory Visit | Attending: *Deleted | Admitting: *Deleted

## 2013-06-26 DIAGNOSIS — M542 Cervicalgia: Secondary | ICD-10-CM

## 2013-06-26 DIAGNOSIS — R0781 Pleurodynia: Secondary | ICD-10-CM

## 2013-06-29 ENCOUNTER — Ambulatory Visit (INDEPENDENT_AMBULATORY_CARE_PROVIDER_SITE_OTHER): Payer: BC Managed Care – PPO | Admitting: *Deleted

## 2013-06-29 VITALS — Temp 98.1°F

## 2013-06-29 DIAGNOSIS — Z23 Encounter for immunization: Secondary | ICD-10-CM

## 2013-06-30 ENCOUNTER — Ambulatory Visit (HOSPITAL_COMMUNITY): Payer: Self-pay | Admitting: Psychiatry

## 2013-06-30 ENCOUNTER — Encounter: Payer: Self-pay | Admitting: Family Medicine

## 2013-06-30 ENCOUNTER — Ambulatory Visit (INDEPENDENT_AMBULATORY_CARE_PROVIDER_SITE_OTHER): Payer: BC Managed Care – PPO | Admitting: Family Medicine

## 2013-06-30 VITALS — BP 130/78 | HR 84 | Temp 98.1°F | Resp 18 | Ht 67.0 in | Wt 234.0 lb

## 2013-06-30 DIAGNOSIS — R5381 Other malaise: Secondary | ICD-10-CM

## 2013-06-30 DIAGNOSIS — H539 Unspecified visual disturbance: Secondary | ICD-10-CM

## 2013-06-30 DIAGNOSIS — R5383 Other fatigue: Secondary | ICD-10-CM

## 2013-06-30 DIAGNOSIS — R4182 Altered mental status, unspecified: Secondary | ICD-10-CM

## 2013-06-30 DIAGNOSIS — G459 Transient cerebral ischemic attack, unspecified: Secondary | ICD-10-CM

## 2013-06-30 LAB — CBC WITH DIFFERENTIAL/PLATELET
Basophils Absolute: 0.1 10*3/uL (ref 0.0–0.1)
Basophils Relative: 1 % (ref 0–1)
Hemoglobin: 15.3 g/dL — ABNORMAL HIGH (ref 12.0–15.0)
Lymphocytes Relative: 26 % (ref 12–46)
MCHC: 33.9 g/dL (ref 30.0–36.0)
MCV: 91.1 fL (ref 78.0–100.0)
Neutro Abs: 11.2 10*3/uL — ABNORMAL HIGH (ref 1.7–7.7)
Neutrophils Relative %: 67 % (ref 43–77)
RBC: 4.95 MIL/uL (ref 3.87–5.11)
RDW: 13.8 % (ref 11.5–15.5)
WBC: 16.6 10*3/uL — ABNORMAL HIGH (ref 4.0–10.5)
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
## 2013-07-01 LAB — VITAMIN B12: Vitamin B-12: 627 pg/mL (ref 211–911)

## 2013-07-01 LAB — COMPLETE METABOLIC PANEL WITH GFR
ALT: 35 U/L (ref 0–35)
Albumin: 4.5 g/dL (ref 3.5–5.2)
Alkaline Phosphatase: 107 U/L (ref 39–117)
BUN: 11 mg/dL (ref 6–23)
Creat: 0.84 mg/dL (ref 0.50–1.10)
GFR, Est Non African American: 82 mL/min
Glucose, Bld: 94 mg/dL (ref 70–99)
Potassium: 5.1 mEq/L (ref 3.5–5.3)
Sodium: 140 mEq/L (ref 135–145)
Total Bilirubin: 0.4 mg/dL (ref 0.3–1.2)
Total Protein: 7.6 g/dL (ref 6.0–8.3)

## 2013-07-01 LAB — TSH: TSH: 1.731 u[IU]/mL (ref 0.350–4.500)

## 2013-07-06 ENCOUNTER — Ambulatory Visit (HOSPITAL_COMMUNITY): Payer: BC Managed Care – PPO

## 2013-07-07 ENCOUNTER — Other Ambulatory Visit: Payer: Self-pay | Admitting: Family Medicine

## 2013-07-07 DIAGNOSIS — D72829 Elevated white blood cell count, unspecified: Secondary | ICD-10-CM

## 2013-07-08 ENCOUNTER — Other Ambulatory Visit (HOSPITAL_COMMUNITY): Payer: Self-pay | Admitting: Family Medicine

## 2013-07-08 ENCOUNTER — Ambulatory Visit (HOSPITAL_COMMUNITY): Payer: BC Managed Care – PPO | Attending: Family Medicine | Admitting: Radiology

## 2013-07-08 DIAGNOSIS — F17200 Nicotine dependence, unspecified, uncomplicated: Secondary | ICD-10-CM | POA: Insufficient documentation

## 2013-07-08 DIAGNOSIS — I059 Rheumatic mitral valve disease, unspecified: Secondary | ICD-10-CM | POA: Insufficient documentation

## 2013-07-08 DIAGNOSIS — G459 Transient cerebral ischemic attack, unspecified: Secondary | ICD-10-CM | POA: Insufficient documentation

## 2013-07-08 DIAGNOSIS — E785 Hyperlipidemia, unspecified: Secondary | ICD-10-CM | POA: Insufficient documentation

## 2013-07-08 DIAGNOSIS — I079 Rheumatic tricuspid valve disease, unspecified: Secondary | ICD-10-CM | POA: Insufficient documentation

## 2013-07-08 DIAGNOSIS — R4182 Altered mental status, unspecified: Secondary | ICD-10-CM

## 2013-07-08 NOTE — Progress Notes (Signed)
Echocardiogram performed.  

## 2013-07-10 ENCOUNTER — Ambulatory Visit
Admission: RE | Admit: 2013-07-10 | Discharge: 2013-07-10 | Disposition: A | Discharge status: Home / Self Care | Payer: BC Managed Care – PPO | Source: Ambulatory Visit | Attending: Family Medicine | Admitting: Family Medicine

## 2013-07-10 DIAGNOSIS — G459 Transient cerebral ischemic attack, unspecified: Secondary | ICD-10-CM

## 2013-07-10 DIAGNOSIS — R4182 Altered mental status, unspecified: Secondary | ICD-10-CM

## 2013-07-10 MED ORDER — GADOBENATE DIMEGLUMINE 529 MG/ML IV SOLN
20.0000 mL | Freq: Once | INTRAVENOUS | Status: AC | PRN
  Administered 2013-07-10: 20 mL via INTRAVENOUS

## 2013-07-13 ENCOUNTER — Ambulatory Visit (HOSPITAL_COMMUNITY): Payer: BC Managed Care – PPO | Attending: Family Medicine

## 2013-07-13 DIAGNOSIS — I658 Occlusion and stenosis of other precerebral arteries: Secondary | ICD-10-CM | POA: Insufficient documentation

## 2013-07-13 DIAGNOSIS — R209 Unspecified disturbances of skin sensation: Secondary | ICD-10-CM

## 2013-07-13 DIAGNOSIS — G459 Transient cerebral ischemic attack, unspecified: Secondary | ICD-10-CM

## 2013-07-13 DIAGNOSIS — R4182 Altered mental status, unspecified: Secondary | ICD-10-CM

## 2013-07-13 DIAGNOSIS — I6529 Occlusion and stenosis of unspecified carotid artery: Secondary | ICD-10-CM

## 2013-07-31 ENCOUNTER — Ambulatory Visit (INDEPENDENT_AMBULATORY_CARE_PROVIDER_SITE_OTHER): Payer: BC Managed Care – PPO | Admitting: Family Medicine

## 2013-07-31 DIAGNOSIS — Z23 Encounter for immunization: Secondary | ICD-10-CM

## 2013-07-31 DIAGNOSIS — D72829 Elevated white blood cell count, unspecified: Secondary | ICD-10-CM

## 2013-08-03 LAB — PATHOLOGIST SMEAR REVIEW

## 2013-08-07 ENCOUNTER — Encounter: Payer: Self-pay | Admitting: Family Medicine

## 2013-08-13 ENCOUNTER — Other Ambulatory Visit: Payer: Self-pay

## 2013-08-25 ENCOUNTER — Other Ambulatory Visit: Payer: BC Managed Care – PPO

## 2013-08-25 ENCOUNTER — Encounter: Payer: Self-pay | Admitting: Family Medicine

## 2013-08-25 DIAGNOSIS — I25810 Atherosclerosis of coronary artery bypass graft(s) without angina pectoris: Secondary | ICD-10-CM

## 2013-08-25 LAB — LIPID PANEL
Cholesterol: 185 mg/dL (ref 0–200)
HDL: 36 mg/dL — ABNORMAL LOW (ref >39)

## 2013-08-30 ENCOUNTER — Encounter: Payer: Self-pay | Admitting: Family Medicine

## 2013-09-22 ENCOUNTER — Ambulatory Visit (INDEPENDENT_AMBULATORY_CARE_PROVIDER_SITE_OTHER): Payer: BC Managed Care – PPO | Admitting: Family Medicine

## 2013-09-22 ENCOUNTER — Encounter: Payer: Self-pay | Admitting: Family Medicine

## 2013-09-22 VITALS — BP 120/86 | HR 80 | Temp 97.6°F | Resp 16 | Ht 66.75 in | Wt 246.0 lb

## 2013-09-22 DIAGNOSIS — D72820 Lymphocytosis (symptomatic): Secondary | ICD-10-CM

## 2013-09-22 DIAGNOSIS — Z0000 Encounter for general adult medical examination without abnormal findings: Secondary | ICD-10-CM
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
## 2013-09-23 ENCOUNTER — Telehealth: Payer: Self-pay | Admitting: Hematology and Oncology

## 2013-09-23 NOTE — Telephone Encounter (Signed)
S/W PT AND GVE NP APPT 12/30 @ 10:30 W/DR. GORSUCH REFERRING DR. Lynnea Ferrier DX-ATYPICAL LYMPHOCYTOSIS  WELCOME PACKET MAILED

## 2013-09-23 NOTE — Telephone Encounter (Signed)
C/D 09/23/13 for appt. 10/06/13

## 2013-10-04 ENCOUNTER — Encounter (HOSPITAL_COMMUNITY): Payer: Self-pay | Admitting: Emergency Medicine

## 2013-10-04 ENCOUNTER — Emergency Department (HOSPITAL_COMMUNITY): Payer: BC Managed Care – PPO

## 2013-10-04 ENCOUNTER — Inpatient Hospital Stay (HOSPITAL_COMMUNITY)
Admission: EM | Admit: 2013-10-04 | Discharge: 2013-10-08 | DRG: 917 | Disposition: A | Discharge status: Other Acute Inpatient Hospital | Payer: BC Managed Care – PPO | Attending: Internal Medicine | Admitting: Internal Medicine

## 2013-10-04 DIAGNOSIS — J9601 Acute respiratory failure with hypoxia: Secondary | ICD-10-CM

## 2013-10-04 DIAGNOSIS — T43502A Poisoning by unspecified antipsychotics and neuroleptics, intentional self-harm, initial encounter: Secondary | ICD-10-CM | POA: Diagnosis present

## 2013-10-04 DIAGNOSIS — F319 Bipolar disorder, unspecified: Secondary | ICD-10-CM | POA: Diagnosis present

## 2013-10-04 DIAGNOSIS — T40601A Poisoning by unspecified narcotics, accidental (unintentional), initial encounter: Secondary | ICD-10-CM | POA: Diagnosis present

## 2013-10-04 DIAGNOSIS — IMO0002 Reserved for concepts with insufficient information to code with codable children: Secondary | ICD-10-CM

## 2013-10-04 DIAGNOSIS — C9110 Chronic lymphocytic leukemia of B-cell type not having achieved remission: Secondary | ICD-10-CM | POA: Diagnosis present

## 2013-10-04 DIAGNOSIS — T39314A Poisoning by propionic acid derivatives, undetermined, initial encounter: Secondary | ICD-10-CM | POA: Diagnosis present

## 2013-10-04 DIAGNOSIS — E039 Hypothyroidism, unspecified: Secondary | ICD-10-CM | POA: Diagnosis present

## 2013-10-04 DIAGNOSIS — Z881 Allergy status to other antibiotic agents status: Secondary | ICD-10-CM

## 2013-10-04 DIAGNOSIS — F17200 Nicotine dependence, unspecified, uncomplicated: Secondary | ICD-10-CM | POA: Diagnosis present

## 2013-10-04 DIAGNOSIS — J9600 Acute respiratory failure, unspecified whether with hypoxia or hypercapnia: Secondary | ICD-10-CM

## 2013-10-04 DIAGNOSIS — Z91040 Latex allergy status: Secondary | ICD-10-CM

## 2013-10-04 DIAGNOSIS — Z79899 Other long term (current) drug therapy: Secondary | ICD-10-CM

## 2013-10-04 DIAGNOSIS — Z6841 Body Mass Index (BMI) 40.0 and over, adult: Secondary | ICD-10-CM

## 2013-10-04 DIAGNOSIS — T50901A Poisoning by unspecified drugs, medicaments and biological substances, accidental (unintentional), initial encounter: Secondary | ICD-10-CM

## 2013-10-04 DIAGNOSIS — E669 Obesity, unspecified: Secondary | ICD-10-CM | POA: Diagnosis present

## 2013-10-04 DIAGNOSIS — Z91018 Allergy to other foods: Secondary | ICD-10-CM

## 2013-10-04 DIAGNOSIS — T43501A Poisoning by unspecified antipsychotics and neuroleptics, accidental (unintentional), initial encounter: Principal | ICD-10-CM | POA: Diagnosis present

## 2013-10-04 DIAGNOSIS — J690 Pneumonitis due to inhalation of food and vomit: Secondary | ICD-10-CM

## 2013-10-04 DIAGNOSIS — T43294A Poisoning by other antidepressants, undetermined, initial encounter: Secondary | ICD-10-CM | POA: Diagnosis present

## 2013-10-04 DIAGNOSIS — T394X2A Poisoning by antirheumatics, not elsewhere classified, intentional self-harm, initial encounter: Secondary | ICD-10-CM | POA: Diagnosis present

## 2013-10-04 LAB — CBC WITH DIFFERENTIAL/PLATELET
Basophils Absolute: 0.1 10*3/uL (ref 0.0–0.1)
Basophils Relative: 0 % (ref 0–1)
Eosinophils Absolute: 0.3 10*3/uL (ref 0.0–0.7)
Eosinophils Relative: 2 % (ref 0–5)
HCT: 40 % (ref 36.0–46.0)
MCH: 31.7 pg (ref 26.0–34.0)
MCHC: 33.3 g/dL (ref 30.0–36.0)
MCV: 95.5 fL (ref 78.0–100.0)
Monocytes Absolute: 0.7 10*3/uL (ref 0.1–1.0)
RDW: 14.4 % (ref 11.5–15.5)

## 2013-10-04 LAB — POCT I-STAT, CHEM 8
Calcium, Ion: 1.13 mmol/L (ref 1.12–1.23)
Chloride: 101 mEq/L (ref 96–112)
HCT: 38 % (ref 36.0–46.0)
Hemoglobin: 12.9 g/dL (ref 12.0–15.0)
Sodium: 138 mEq/L (ref 135–145)
TCO2: 26 mmol/L (ref 0–100)

## 2013-10-04 LAB — POCT I-STAT 3, ART BLOOD GAS (G3+)
O2 Saturation: 99 %
TCO2: 26 mmol/L (ref 0–100)
pCO2 arterial: 36.7 mmHg (ref 35.0–45.0)
pH, Arterial: 7.435 (ref 7.350–7.450)
pO2, Arterial: 132 mmHg — ABNORMAL HIGH (ref 80.0–100.0)

## 2013-10-04 LAB — URINALYSIS, ROUTINE W REFLEX MICROSCOPIC
Glucose, UA: NEGATIVE mg/dL
Hgb urine dipstick: NEGATIVE
Ketones, ur: NEGATIVE mg/dL
Leukocytes, UA: NEGATIVE
Nitrite: NEGATIVE
Specific Gravity, Urine: 1.017 (ref 1.005–1.030)
pH: 5.5 (ref 5.0–8.0)

## 2013-10-04 LAB — COMPREHENSIVE METABOLIC PANEL
Albumin: 3.3 g/dL — ABNORMAL LOW (ref 3.5–5.2)
Alkaline Phosphatase: 102 U/L (ref 39–117)
BUN: 13 mg/dL (ref 6–23)
CO2: 24 mEq/L (ref 19–32)
Calcium: 8.5 mg/dL (ref 8.4–10.5)
Creatinine, Ser: 0.75 mg/dL (ref 0.50–1.10)
GFR calc Af Amer: >90 mL/min (ref >90)
GFR calc non Af Amer: >90 mL/min (ref >90)
Glucose, Bld: 105 mg/dL — ABNORMAL HIGH (ref 70–99)
Potassium: 4 mEq/L (ref 3.5–5.1)
Total Protein: 7 g/dL (ref 6.0–8.3)

## 2013-10-04 LAB — RAPID URINE DRUG SCREEN, HOSP PERFORMED
Amphetamines: POSITIVE — AB
Benzodiazepines: POSITIVE — AB
Cocaine: NOT DETECTED
Opiates: POSITIVE — AB
Tetrahydrocannabinol: NOT DETECTED

## 2013-10-04 LAB — AMMONIA: Ammonia: 22 umol/L (ref 11–60)

## 2013-10-04 LAB — URINE MICROSCOPIC-ADD ON

## 2013-10-04 LAB — SALICYLATE LEVEL: Salicylate Lvl: <2 mg/dL — ABNORMAL LOW (ref 2.8–20.0)

## 2013-10-04 LAB — ETHANOL: Alcohol, Ethyl (B): <11 mg/dL (ref 0–11)

## 2013-10-04 LAB — GLUCOSE, CAPILLARY: Glucose-Capillary: 108 mg/dL — ABNORMAL HIGH (ref 70–99)

## 2013-10-04 LAB — CG4 I-STAT (LACTIC ACID): Lactic Acid, Venous: 1.85 mmol/L (ref 0.5–2.2)

## 2013-10-04 LAB — LACTIC ACID, PLASMA: Lactic Acid, Venous: 1.9 mmol/L (ref 0.5–2.2)

## 2013-10-04 MED ORDER — SODIUM CHLORIDE 0.9 % IV SOLN: 250.0000 mL | INTRAVENOUS | Status: DC | PRN

## 2013-10-04 MED ORDER — ROCURONIUM BROMIDE 50 MG/5ML IV SOLN
INTRAVENOUS | Status: AC
  Administered 2013-10-04: 100 mg
  Filled 2013-10-04: qty 2

## 2013-10-04 MED ORDER — ASPIRIN 300 MG RE SUPP
300.0000 mg | RECTAL | Status: AC
  Administered 2013-10-05: 300 mg via RECTAL
  Filled 2013-10-04: qty 1

## 2013-10-04 MED ORDER — LIDOCAINE HCL (CARDIAC) 20 MG/ML IV SOLN
INTRAVENOUS | Status: AC
  Filled 2013-10-04: qty 5

## 2013-10-04 MED ORDER — ETOMIDATE 2 MG/ML IV SOLN
INTRAVENOUS | Status: AC
  Administered 2013-10-04: 30 mg
  Filled 2013-10-04: qty 20

## 2013-10-04 MED ORDER — ASPIRIN 81 MG PO CHEW: 324.0000 mg | CHEWABLE_TABLET | ORAL | Status: AC

## 2013-10-04 MED ORDER — NALOXONE HCL 1 MG/ML IJ SOLN
INTRAMUSCULAR | Status: AC
  Administered 2013-10-04: 2 mg
  Filled 2013-10-04: qty 2

## 2013-10-04 MED ORDER — HEPARIN SODIUM (PORCINE) 5000 UNIT/ML IJ SOLN
5000.0000 [IU] | Freq: Three times a day (TID) | INTRAMUSCULAR | Status: DC
  Administered 2013-10-05 – 2013-10-08 (×12): 5000 [IU] via SUBCUTANEOUS
  Filled 2013-10-04 (×16): qty 1

## 2013-10-04 MED ORDER — PROPOFOL 10 MG/ML IV EMUL
INTRAVENOUS | Status: AC
  Filled 2013-10-04: qty 100

## 2013-10-04 MED ORDER — SUCCINYLCHOLINE CHLORIDE 20 MG/ML IJ SOLN
INTRAMUSCULAR | Status: AC
  Filled 2013-10-04: qty 1

## 2013-10-04 MED ORDER — SODIUM CHLORIDE 0.9 % IV BOLUS (SEPSIS)
1000.0000 mL | Freq: Once | INTRAVENOUS | Status: AC
  Administered 2013-10-04: 1000 mL via INTRAVENOUS

## 2013-10-04 MED ORDER — PANTOPRAZOLE SODIUM 40 MG IV SOLR
40.0000 mg | INTRAVENOUS | Status: DC
  Administered 2013-10-05: 40 mg via INTRAVENOUS
  Filled 2013-10-04 (×2): qty 40

## 2013-10-04 NOTE — ED Notes (Signed)
Pt's son updated, pt's clothing and medication given to son; Andrew Hansome. Clothing cut and removed, quilt and medication placed in 2 pt belonging bags

## 2013-10-04 NOTE — Progress Notes (Signed)
eLink Physician-Brief Progress Note Patient Name: Theresa Castillo DOB: [birth year] MRN: [id number]  Date of Service  10/04/2013   HPI/Events of Note   Pt needs SUP  eICU Interventions  PPI ordered    Intervention Category Intermediate Interventions: Best-practice therapies (e.g. DVT, beta blocker, etc.)  Shan Levans 10/04/2013, 11:14 PM

## 2013-10-04 NOTE — ED Provider Notes (Signed)
CSN: [id number]     Arrival date & time 10/04/13  2118 History   First MD Initiated Contact with Patient 10/04/13 2137     Chief Complaint  Patient presents with  . Drug Overdose   HPI  Pt presents via EMS after apparent drug overdose. Per EMS the patient sent out multiple emails about wanting to end her life. [employer] went to the scene to find the patient unresponsive with empty pill bottle. The patient was found with multiple pill bottles with some still filled and some empty. Notably the patient's hydrocodone was empty. EMS gave 2 mg of narcan without significant response. An NPA was placed and EMS assisted ventilations en route. History and ROS limited secondary to the mental status of the patient.   Past Medical History  Diagnosis Date  . Allergy     pineapple abd pain and diarrhea  . Anxiety   . Depression   . Thyroid disease     borderline hypo  . Ulcer     born with stomach ulcer  . Hyperlipidemia   . 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 Chronic pain of right wrist   . Abnormal Pap smear    Past Surgical History  Procedure Laterality Date  . Cholecystectomy  1995  . Salpingoophorectomy  1984    right  . Ankle surgery  2003    right  . Pilonial cyst removed twice    . Tubal ligation    . Oophorectomy    . Uterine fibroid surgery    . 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 Gum surgery     Family History  Problem Relation Age of Onset  . Colon cancer Father   . Heart disease Father   . Heart attack Father   . Cancer Mother     breast/lukemia  . Cancer Maternal Aunt     breast and thyroid  . Cancer Paternal Aunt     breast  . Cancer Maternal Grandmother     colon   History  Substance Use Topics  . Smoking status: Current Every Day Smoker -- 1.00 packs/day for  30 years    Types: Cigarettes  . Smokeless tobacco: Never Used     Comment: never done snuff or chewing tobacco.  . Alcohol Use: Yes     Comment: rarely   OB History   Grav Para Term Preterm Abortions TAB SAB Ect Mult Living                 Review of Systems  Unable to perform ROS: Mental status change    Allergies  Ammonia; Amoxicillin; Topamax; Pineapple; Latex; and Trazodone and nefazodone  Home Medications  No current outpatient prescriptions on file. BP 125/81  Pulse 95  Temp(Src) 97.3 F (36.3 C) (Oral)  Resp 18  Ht 5\' 4"  (1.626 m)  Wt 242 lb 4.6 oz (109.9 kg)  BMI 41.57 kg/m2  SpO2 98% Physical Exam  Nursing note and vitals reviewed. Constitutional: She appears well-developed. She appears lethargic.  HENT:  Head: Normocephalic and atraumatic.  Eyes: Conjunctivae are normal. Pupils are equal, round, and reactive to light.  Neck: Neck supple.  Cardiovascular: Normal rate and regular rhythm.  Exam reveals no gallop and no friction rub.   No  murmur heard. Pulmonary/Chest: Effort normal and breath sounds normal.  Abdominal: Soft. She exhibits no distension.  Musculoskeletal:  atrumatic  Neurological: She appears lethargic. GCS eye subscore is 1. GCS verbal subscore is 1. GCS motor subscore is 4.  Skin: Skin is warm and dry.  Psychiatric: She has a normal mood and affect.    ED Course  INTUBATION Date/Time: 10/05/2013 12:31 AM Performed by: Shanon Ace Authorized by: Shanon Ace Consent: The procedure was performed in an emergent situation. Time out: Immediately prior to procedure a "time out" was called to verify the correct patient, procedure, equipment, support staff and site/side marked as required. Indications: airway protection Intubation method: video-assisted Patient status: paralyzed (RSI) Sedatives: etomidate Paralytic: rocuronium Laryngoscope size: Mac 3 Tube size: 7.5 mm Tube type: cuffed Number of attempts: 1 Post-procedure  assessment: chest rise and ETCO2 monitor Breath sounds: equal and absent over the epigastrium Cuff inflated: yes ETT to lip: 23 cm ETT to teeth: 21 cm Tube secured with: ETT holder Chest x-ray interpreted by me.   (including critical care time) Labs Review Labs Reviewed  CBC WITH DIFFERENTIAL - Abnormal; Notable for the following:    WBC 15.4 (*)    Neutro Abs 11.0 (*)    All other components within normal limits  SALICYLATE LEVEL - Abnormal; Notable for the following:    Salicylate Lvl <2.0 (*)    All other components within normal limits  COMPREHENSIVE METABOLIC PANEL - Abnormal; Notable for the following:    Sodium 133 (*)    Glucose, Bld 105 (*)    Albumin 3.3 (*)    Total Bilirubin 0.2 (*)    All other components within normal limits  URINALYSIS, ROUTINE W REFLEX MICROSCOPIC - Abnormal; Notable for the following:    APPearance CLOUDY (*)    Protein, ur 30 (*)    All other components within normal limits  GLUCOSE, CAPILLARY - Abnormal; Notable for the following:    Glucose-Capillary 108 (*)    All other components within normal limits  URINE RAPID DRUG SCREEN (HOSP PERFORMED) - Abnormal; Notable for the following:    Opiates POSITIVE (*)    Benzodiazepines POSITIVE (*)    Amphetamines POSITIVE (*)    All other components within normal limits  URINE MICROSCOPIC-ADD ON - Abnormal; Notable for the following:    Casts HYALINE CASTS (*)    All other components within normal limits  POCT I-STAT 3, BLOOD GAS (G3+) - Abnormal; Notable for the following:    pO2, Arterial 132.0 (*)    Bicarbonate 24.7 (*)    All other components within normal limits  POCT I-STAT, CHEM 8 - Abnormal; Notable for the following:    Glucose, Bld 107 (*)    All other components within normal limits  URINE CULTURE  MRSA PCR SCREENING  ACETAMINOPHEN LEVEL  AMMONIA  ETHANOL  LACTIC ACID, PLASMA  URINALYSIS, ROUTINE W REFLEX MICROSCOPIC  BLOOD GAS, ARTERIAL  OSMOLALITY  TSH  CBC  BASIC  METABOLIC PANEL  BLOOD GAS, ARTERIAL  MAGNESIUM  PHOSPHORUS  COMPREHENSIVE METABOLIC PANEL  ACETAMINOPHEN LEVEL  PROTIME-INR  CG4 I-STAT (LACTIC ACID)  POCT I-STAT TROPONIN I   Imaging Review Ct Head Wo Contrast  10/04/2013   CLINICAL DATA:  Drug overdose.  Patient is not speaking.  EXAM: CT HEAD WITHOUT CONTRAST  TECHNIQUE: Contiguous axial images were obtained from the base of the skull through the vertex without intravenous contrast.  COMPARISON:  11/02/2008  FINDINGS: Patient is rotated, limiting examination. Endotracheal  and OG tubes are in place. Ventricles and sulci appear symmetrical. No mass effect or midline shift. No abnormal extra-axial fluid collections. Gray-white matter junctions are distinct. Basal cisterns are not effaced. No evidence of acute intracranial hemorrhage. No depressed skull fractures. Visualized paranasal sinuses and mastoid air cells are not opacified. No significant change since previous study.  IMPRESSION: No acute intracranial abnormalities demonstrated.   Electronically Signed   By: Burman Nieves M.D.   On: 10/04/2013 23:01   Dg Chest Portable 1 View  10/04/2013   CLINICAL DATA:  Drug overdose.  Intubation.  EXAM: PORTABLE CHEST - 1 VIEW  COMPARISON:  One view chest x-ray 06/26/2013.  FINDINGS: Endotracheal tube tip in satisfactory position projecting approximately 3 cm above the carina. Linear atelectasis in the lung bases bilaterally. Patchy airspace opacity suspected in the right upper lobe. Lungs otherwise clear. No visible pleural effusions. Cardiomediastinal silhouette unremarkable and unchanged.  IMPRESSION: 1. Endotracheal tube tip in satisfactory position projecting approximately 3 cm above the carina. 2. Atelectasis in both lung bases. 3. Possible developing pneumonia in the right upper lobe, query aspiration.   Electronically Signed   By: Hulan Saas M.D.   On: 10/04/2013 22:17    EKG Interpretation    Date/Time:    Ventricular Rate:    PR  Interval:    QRS Duration:   QT Interval:    QTC Calculation:   R Axis:     Text Interpretation:              MDM   Presents after reported drug overdose. EKG without acute ischemic changes. FSBG ok. No improvement after Narcan. The patient was intubated for airway protection without difficulty or complication. No anion gap. Likely polysubstance ingestion. CT head without acute abnormality. The patient was admitted to the ICU for continued workup and management.    1. Drug overdose, initial encounter       Shanon Ace, MD 10/05/13 [phone]

## 2013-10-04 NOTE — ED Notes (Signed)
Preparing to intubate patient, EDP and resident at bedside

## 2013-10-04 NOTE — ED Notes (Signed)
Pt sent out multiple emails about wanting to end her life.  [employer] to scene and found patient to be unresponsive and an empty bottle of hydrocodone in garbage can.  PT has NPA in and EMS was assisting ventilations en route.  Pt was given Narcan by ems and improved RR rate. Pt is not speaking.

## 2013-10-04 NOTE — ED Provider Notes (Signed)
Pt seen on arrival, obtunded from apparent overdose She had minimal response to narcan Due to concern for overdose and her depressed mental status, pt was intubated by resident Will follow closely    Date: 10/04/2013  Rate: 93  Rhythm: normal sinus rhythm  QRS Axis: normal  Intervals: normal  ST/T Wave abnormalities: nonspecific ST changes  Conduction Disutrbances:none  Narrative Interpretation:   Old EKG Reviewed: none available at time of interpretation    Joya Gaskins, MD 10/04/13 2142

## 2013-10-04 NOTE — ED Notes (Signed)
Patient transported to CT with RT and Janeann Forehand

## 2013-10-04 NOTE — H&P (Signed)
Name: Theresa Castillo MRN: [id number] DOB: June 08, 1966    ADMISSION DATE:  10/04/2013 CONSULTATION DATE:  10/04/2013  REFERRING MD :  ED PRIMARY SERVICE: MICU  CHIEF COMPLAINT:  AMS , Suicidal   BRIEF PATIENT DESCRIPTION: Recent Suicidal ideation with multiple drug OD and respiratory depression requiring intubation in the ED     LINES / TUBES: ETT  10/04/2013  CULTURES: P  ANTIBIOTICS: None   HISTORY OF PRESENT ILLNESS:    Patient is 47 yrs old CF with hx of Depression , Hypothyroid , found down at home with multiple bottles around , Benzo , Narcotics , Dexepin , , Naracan given by EMS with minimal response , here 2 mg IV narcan given with no response , intubated for airway protection   PAST MEDICAL HISTORY :  Past Medical History  Diagnosis Date  . Allergy     pineapple abd pain and diarrhea  . Anxiety   . Depression   . Thyroid disease     borderline hypo  . Ulcer     born with stomach ulcer  . Hyperlipidemia   . 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 Chronic pain of right wrist   . Abnormal Pap smear    Past Surgical History  Procedure Laterality Date  . Cholecystectomy  1995  . Salpingoophorectomy  1984    right  . Ankle surgery  2003    right  . Pilonial cyst removed twice    . Tubal ligation    . Oophorectomy    . Uterine fibroid surgery    . Breast biopsy  08/22/2011    Procedure: BREAST BIOPSY WITH NEEDLE LOCALIZATION;  Surgeon: Ernestene Mention, MD;  Location: [facility] OR;  Service: General;  Laterality: Left;  Left  BREAST DUCT EXCISION & PARATIAL MASTECTOMY W/NEEDLE LOCALIZATION  . Gum surgery     Prior to Admission medications   Medication Sig Start Date End Date Taking? Authorizing Provider  ALPRAZolam Prudy Feeler) 1 MG tablet Take 0.5-1 mg by mouth at bedtime.    Yes Historical Provider, MD  amphetamine-dextroamphetamine (ADDERALL) 10 MG tablet Take 10 mg by mouth 2 (two) times daily with a meal.    Yes Historical Provider, MD    amphetamine-dextroamphetamine (ADDERALL) 20 MG tablet Take 20 mg by mouth every morning.   Yes Historical Provider, MD  asenapine (SAPHRIS) 5 MG SUBL 24 hr tablet Place 5 mg under the tongue 2 (two) times daily.   Yes Historical Provider, MD  benzonatate (TESSALON) 200 MG capsule Take 200 mg by mouth 3 (three) times daily.   Yes Historical Provider, MD  buPROPion (WELLBUTRIN XL) 150 MG 24 hr tablet Take 150 mg by mouth daily.   Yes Historical Provider, MD  buPROPion (WELLBUTRIN XL) 300 MG 24 hr tablet Take 300 mg by mouth daily.   Yes Historical Provider, MD  CRANBERRY PO Take 4,200 mg by mouth daily.   Yes Historical Provider, MD  doxepin (SINEQUAN) 10 MG capsule Take 10-20 mg by mouth at bedtime.   Yes Historical Provider, MD  folic acid (FOLVITE) 1 MG tablet Take 2 mg by mouth every morning.    Yes Historical Provider, MD  HYDROcodone-acetaminophen (NORCO) 7.5-325 MG per tablet Take 1 tablet by mouth every 6 (six) hours as needed for moderate pain.   Yes Historical Provider, MD  HYDROcodone-acetaminophen (NORCO/VICODIN) 5-325 MG per tablet Take 1 tablet by mouth every 6 (six) hours as needed for moderate pain.  Yes Historical Provider, MD  ibuprofen (ADVIL,MOTRIN) 800 MG tablet Take 800 mg by mouth every 8 (eight) hours as needed for moderate pain.   Yes Historical Provider, MD  levothyroxine (SYNTHROID, LEVOTHROID) 50 MCG tablet Take 50 mcg by mouth every morning.   Yes Historical Provider, MD  loratadine (CLARITIN) 10 MG tablet Take 10 mg by mouth daily.   Yes Historical Provider, MD  meloxicam (MOBIC) 15 MG tablet Take 15 mg by mouth daily.   Yes Historical Provider, MD  naproxen sodium (ANAPROX) 550 MG tablet Take 550 mg by mouth 2 (two) times daily with a meal.   Yes Historical Provider, MD  OLANZapine (ZYPREXA) 5 MG tablet Take 5 mg by mouth at bedtime.   Yes Historical Provider, MD  Omega-3 Fatty Acids (FISH OIL PO) Take 3,600 Units by mouth daily.   Yes Historical Provider, MD   venlafaxine XR (EFFEXOR-XR) 150 MG 24 hr capsule Take 150 mg by mouth daily with breakfast.   Yes Historical Provider, MD   Allergies  Allergen Reactions  . Ammonia Other (See Comments)    Severe migraines  . Amoxicillin Other (See Comments)    syncope  . Topamax Other (See Comments)    Caused neurological deficits, confusion, bad intentions   . Pineapple Nausea Only  . Latex Rash and Other (See Comments)    Cracking of skin  . Trazodone And Nefazodone Other (See Comments)    unknown    FAMILY HISTORY:  Family History  Problem Relation Age of Onset  . Colon cancer Father   . Heart disease Father   . Heart attack Father   . Cancer Mother     breast/lukemia  . Cancer Maternal Aunt     breast and thyroid  . Cancer Paternal Aunt     breast  . Cancer Maternal Grandmother     colon   SOCIAL HISTORY:  reports that she has been smoking Cigarettes.  She has a 30 pack-year smoking history. She has never used smokeless tobacco. She reports that she drinks alcohol. She reports that she does not use illicit drugs.  REVIEW OF SYSTEMS:  Unobtainable   SUBJECTIVE:   VITAL SIGNS: Temp:  [95.3 F (35.2 C)] 95.3 F (35.2 C) (12/28 2236) Pulse Rate:  [92-113] 95 (12/28 2230) Resp:  [15-22] 18 (12/28 2230) BP: (118-158)/(76-101) 125/81 mmHg (12/28 2230) SpO2:  [93 %-100 %] 98 % (12/28 2230) FiO2 (%):  [40 %-100 %] 40 % (12/28 2204) HEMODYNAMICS:   VENTILATOR SETTINGS: Vent Mode:  [-] PRVC FiO2 (%):  [40 %-100 %] 40 % Set Rate:  [18 bmp-22 bmp] 18 bmp Vt Set:  [500 mL] 500 mL PEEP:  [5 cmH20] 5 cmH20 Plateau Pressure:  [20 cmH20] 20 cmH20 INTAKE / OUTPUT: Intake/Output   None     PHYSICAL EXAMINATION: General:  Sedated intubated , paralyzed , NAD  Neuro:  Sedated intubated , paralyzed , NAD HEENT:  atraumatic , no jaundice , dry mucous membranes  Cardiovascular: NS1S2 , regular   Lungs:  CTA bilateral , no wheezing or crackles  Abdomen:  Soft lax +BS , no tenderness  . Musculoskeletal:  WNL , normal pulses  Skin:  No rash    LABS:  CBC  Recent Labs Lab 10/04/13 2205 10/04/13 2214  WBC 15.4*  --   HGB 13.3 12.9  HCT 40.0 38.0  PLT 270  --    Coag's No results found for this basename: APTT, INR,  in the last 168 hours BMET  Recent Labs Lab 10/04/13 2214  NA 138  K 4.1  CL 101  BUN 13  CREATININE 0.90  GLUCOSE 107*   Electrolytes No results found for this basename: CALCIUM, MG, PHOS,  in the last 168 hours Sepsis Markers  Recent Labs Lab 10/04/13 2205 10/04/13 2216  LATICACIDVEN 1.9 1.85   ABG  Recent Labs Lab 10/04/13 2158  PHART 7.435  PCO2ART 36.7  PO2ART 132.0*   Liver Enzymes No results found for this basename: AST, ALT, ALKPHOS, BILITOT, ALBUMIN,  in the last 168 hours Cardiac Enzymes No results found for this basename: TROPONINI, PROBNP,  in the last 168 hours Glucose  Recent Labs Lab 10/04/13 2124  GLUCAP 108*    Imaging Dg Chest Portable 1 View  10/04/2013   CLINICAL DATA:  Drug overdose.  Intubation.  EXAM: PORTABLE CHEST - 1 VIEW  COMPARISON:  One view chest x-ray 06/26/2013.  FINDINGS: Endotracheal tube tip in satisfactory position projecting approximately 3 cm above the carina. Linear atelectasis in the lung bases bilaterally. Patchy airspace opacity suspected in the right upper lobe. Lungs otherwise clear. No visible pleural effusions. Cardiomediastinal silhouette unremarkable and unchanged.  IMPRESSION: 1. Endotracheal tube tip in satisfactory position projecting approximately 3 cm above the carina. 2. Atelectasis in both lung bases. 3. Possible developing pneumonia in the right upper lobe, query aspiration.   Electronically Signed   By: Hulan Saas M.D.   On: 10/04/2013 22:17     CXR:   ASSESSMENT / PLAN:   47 yrs old CF with hx of depression , Hypothyroidism with recent admission for AMS and poly pharmacy , now found down with multiple empty bottles of medications in the field ,  depressed MS , GCS around 7 in the ED , minimal response in the ED to 2 mg of Narcan , intubated for airway protection .  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 Vent support till she wakes up , no need for reversal of offending agents  as she is intubated  P:    Assess when more awake and responsive, C/W vent support for now . 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 Extubate when awake , most likley in 24-48 hrs if no permanent  brain damage from when she was down at home .  I have personally obtained a history, examined the patient, evaluated laboratory and imaging results, formulated the assessment and plan and placed orders. CRITICAL CARE: The patient is critically ill with multiple organ systems failure and requires high complexity decision  making for assessment and support, frequent evaluation and titration of therapies, application of advanced monitoring technologies and extensive interpretation of multiple databases. Critical Care Time devoted to patient care services described in this note is  00 minutes.    Pulmonary and Critical Care Medicine [facility] Pager: [phone]  10/04/2013, 10:46 PM

## 2013-10-04 NOTE — ED Notes (Signed)
Dr. Catha Gosselin at bedside.

## 2013-10-04 NOTE — ED Notes (Signed)
Pt admitted to 2 S, report given to [employer], pt transported to CT and pt's room by [employer] and RT

## 2013-10-05 ENCOUNTER — Encounter (HOSPITAL_COMMUNITY): Payer: Self-pay | Admitting: Certified Registered Nurse Anesthetist

## 2013-10-05 DIAGNOSIS — Z5189 Encounter for other specified aftercare: Secondary | ICD-10-CM

## 2013-10-05 DIAGNOSIS — J9600 Acute respiratory failure, unspecified whether with hypoxia or hypercapnia: Secondary | ICD-10-CM

## 2013-10-05 LAB — TROPONIN I: Troponin I: <0.3 ng/mL (ref <0.30)

## 2013-10-05 LAB — COMPREHENSIVE METABOLIC PANEL
ALT: 12 U/L (ref 0–35)
AST: 17 U/L (ref 0–37)
Albumin: 3.2 g/dL — ABNORMAL LOW (ref 3.5–5.2)
Alkaline Phosphatase: 96 U/L (ref 39–117)
Chloride: 103 mEq/L (ref 96–112)
GFR calc Af Amer: >90 mL/min (ref >90)
Glucose, Bld: 96 mg/dL (ref 70–99)
Potassium: 3.7 mEq/L (ref 3.5–5.1)
Sodium: 137 mEq/L (ref 135–145)
Total Bilirubin: 0.4 mg/dL (ref 0.3–1.2)
Total Protein: 6.8 g/dL (ref 6.0–8.3)

## 2013-10-05 LAB — BASIC METABOLIC PANEL
BUN: 12 mg/dL (ref 6–23)
GFR calc Af Amer: >90 mL/min (ref >90)
GFR calc non Af Amer: >90 mL/min (ref >90)
Glucose, Bld: 87 mg/dL (ref 70–99)
Potassium: 3.5 mEq/L (ref 3.5–5.1)
Sodium: 137 mEq/L (ref 135–145)

## 2013-10-05 LAB — PHOSPHORUS: Phosphorus: 4 mg/dL (ref 2.3–4.6)

## 2013-10-05 LAB — CBC
HCT: 39.8 % (ref 36.0–46.0)
MCH: 31.5 pg (ref 26.0–34.0)
MCHC: 33.4 g/dL (ref 30.0–36.0)
RDW: 14.3 % (ref 11.5–15.5)
WBC: 15.3 10*3/uL — ABNORMAL HIGH (ref 4.0–10.5)

## 2013-10-05 LAB — BLOOD GAS, ARTERIAL
Bicarbonate: 25 mEq/L — ABNORMAL HIGH (ref 20.0–24.0)
O2 Saturation: 98.7 %
Patient temperature: 98.6
TCO2: 26.1 mmol/L (ref 0–100)
pH, Arterial: 7.427 (ref 7.350–7.450)
pO2, Arterial: 116 mmHg — ABNORMAL HIGH (ref 80.0–100.0)

## 2013-10-05 LAB — GLUCOSE, CAPILLARY
Glucose-Capillary: 84 mg/dL (ref 70–99)
Glucose-Capillary: 88 mg/dL (ref 70–99)

## 2013-10-05 LAB — TSH: TSH: 5.215 u[IU]/mL — ABNORMAL HIGH (ref 0.350–4.500)

## 2013-10-05 LAB — OSMOLALITY: Osmolality: 284 mOsm/kg (ref 275–300)

## 2013-10-05 LAB — MRSA PCR SCREENING: MRSA by PCR: NEGATIVE

## 2013-10-05 LAB — PROTIME-INR: INR: 0.99 (ref 0.00–1.49)

## 2013-10-05 MED ORDER — PANTOPRAZOLE SODIUM 40 MG IV SOLR
40.0000 mg | Freq: Two times a day (BID) | INTRAVENOUS | Status: DC
  Administered 2013-10-05 – 2013-10-06 (×3): 40 mg via INTRAVENOUS
  Filled 2013-10-05 (×4): qty 40

## 2013-10-05 MED ORDER — POTASSIUM CHLORIDE 10 MEQ/100ML IV SOLN
10.0000 meq | INTRAVENOUS | Status: AC
  Administered 2013-10-05 (×2): 10 meq via INTRAVENOUS
  Filled 2013-10-05 (×2): qty 100

## 2013-10-05 MED ORDER — FENTANYL CITRATE 0.05 MG/ML IJ SOLN: 100.0000 ug | INTRAMUSCULAR | Status: DC | PRN

## 2013-10-05 MED ORDER — PROPOFOL 10 MG/ML IV EMUL
0.0000 ug/kg/min | INTRAVENOUS | Status: DC
  Administered 2013-10-04: 22:00:00 via INTRAVENOUS

## 2013-10-05 MED ORDER — SODIUM CHLORIDE 0.9 % IV SOLN
INTRAVENOUS | Status: DC
  Administered 2013-10-05 – 2013-10-06 (×2): via INTRAVENOUS

## 2013-10-05 MED ORDER — LEVOTHYROXINE SODIUM 100 MCG IV SOLR
50.0000 ug | Freq: Once | INTRAVENOUS | Status: AC
  Administered 2013-10-05: 50 ug via INTRAVENOUS
  Filled 2013-10-05: qty 5

## 2013-10-05 NOTE — Progress Notes (Signed)
Dr Delton Coombes updated pt on CPAP since ~1500. MD updated tidal volume/RR/O2 sat. Updated pt resting, arousable, follows commands, can stick tongue out/lift head off pillow. MD stated ok to extubate, will notify RT. Will continue to monitor. Koren Bound

## 2013-10-05 NOTE — Progress Notes (Signed)
Dr Vassie Loll updated at bedside, MD updated diprivan gtt off since ~0900, pt RASS -2, will arouse and follow commands. MD stated attempt CPAP and extubate when pt more alert, pt placed on CPAP/PSV, pt with periods of apnea/low tidal volumes, will continue to assess pt LOC and attempt further vent weaning to prepare pt for extubation. MD updated dark brown, ?bloody output via OGT. Per pt son, pt with history of stomach ulcers. MD stated to clamp OGT for now. MD updated pt son at bedside. Will continue to monitor. Koren Bound

## 2013-10-05 NOTE — Progress Notes (Signed)
INITIAL NUTRITION ASSESSMENT  DOCUMENTATION CODES Per approved criteria  -Morbid Obesity   INTERVENTION:  If EN started, recommend initiation Vital HP formula at 15 ml/hr and increase by 10 ml every 4 hours to goal rate of 45 ml/hr with Prostat liquid protein 30 ml via tube to provide 1180 kcals (65% of estimated kcal needs), 110 gm protein (100% of estimated protein needs), 903 ml of free water RD to follow for nutrition care plan  NUTRITION DIAGNOSIS: Inadequate oral intake related to inability to eat as evidenced by NPO status  Goal: Initiate EN support within next 24-48 hours if prolonged intubation expected  Monitor:  EN initiation & tolerance, respiratory status, weight, labs, I/O's  Reason for Assessment: VDRF  47 y.o. female  Admitting Dx: AMS, suicidal   ASSESSMENT: Patient with PMH of depression, hypothyroidism with recent admission for AMS and polypharmacy; found down with multiple empty bottles of medications; minimal responseviness in ED.  Patient is currently intubated on ventilator support -- OGT in p lace MV: 9.0 L/min Temp (24hrs), Avg:96.8 F (36 C), Min:95.3 F (35.2 C), Max:97.9 F (36.6 C)   Patient weaned off Propofol infusion.  Plan for extubation once patient fully awake.  Height: Ht Readings from Last 1 Encounters:  10/04/13 5\' 4"  (1.626 m)    Weight: Wt Readings from Last 1 Encounters:  10/05/13 242 lb 4.6 oz (109.9 kg)    Ideal Body Weight: 120 lb  % Ideal Body Weight: 202%  Wt Readings from Last 10 Encounters:  10/05/13 242 lb 4.6 oz (109.9 kg)  09/22/13 246 lb (111.585 kg)  06/30/13 234 lb (106.142 kg)  06/22/13 235 lb (106.595 kg)  05/13/13 249 lb (112.946 kg)  05/07/13 246 lb (111.585 kg)  04/29/13 248 lb (112.492 kg)  04/08/13 253 lb 12.8 oz (115.123 kg)  03/16/13 265 lb (120.203 kg)  02/04/13 265 lb (120.203 kg)    Usual Body Weight: 246 lb  % Usual Body Weight: 98%  BMI:  Body mass index is 41.57  kg/(m^2).  Estimated Nutritional Needs: Kcal: 1826 Protein: 110-120 gm Fluid: 1.8-2.0 L  Skin: Intact  Diet Order: NPO  EDUCATION NEEDS: -No education needs identified at this time   Intake/Output Summary (Last 24 hours) at 10/05/13 1147 Last data filed at 10/05/13 1000  Gross per 24 hour  Intake 1533.51 ml  Output   1525 ml  Net   8.51 ml    Labs:   Recent Labs Lab 10/04/13 2205 10/04/13 2214 10/05/13 0125 10/05/13 0355  NA 133* 138 137 137  K 4.0 4.1 3.7 3.5  CL 98 101 103 102  CO2 24  --  23 25  BUN 13 13 13 12   CREATININE 0.75 0.90 0.73 0.68  CALCIUM 8.5  --  8.7 8.8  MG  --   --   --  2.0  PHOS  --   --   --  4.0  GLUCOSE 105* 107* 96 87    CBG (last 3)   Recent Labs  10/04/13 2124 10/05/13 0726  GLUCAP 108* 84    Scheduled Meds: . heparin  5,000 Units Subcutaneous Q8H  . pantoprazole (PROTONIX) IV  40 mg Intravenous Q12H    Continuous Infusions: . sodium chloride 75 mL/hr (10/05/13 1000)    Past Medical History  Diagnosis Date  . Allergy     pineapple abd pain and diarrhea  . Anxiety   . Depression   . Thyroid disease     borderline hypo  .  Ulcer     born with stomach ulcer  . Hyperlipidemia   . 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 Chronic pain of right wrist   . Abnormal Pap smear     Past Surgical History  Procedure Laterality Date  . Cholecystectomy  1995  . Salpingoophorectomy  1984    right  . Ankle surgery  2003    right  . Pilonial cyst removed twice    . Tubal ligation    . Oophorectomy    . Uterine fibroid surgery    . Breast biopsy  08/22/2011    Procedure: BREAST BIOPSY WITH NEEDLE LOCALIZATION;  Surgeon: Ernestene Mention, MD;  Location: [facility] OR;  Service: General;  Laterality: Left;  Left  BREAST DUCT EXCISION & PARATIAL MASTECTOMY W/NEEDLE LOCALIZATION  . Gum surgery      Maureen Chatters, RD, LDN Pager #: [phone] After-Hours Pager #: [phone]

## 2013-10-05 NOTE — Progress Notes (Signed)
[facility] ADULT ICU REPLACEMENT PROTOCOL FOR AM LAB REPLACEMENT ONLY  The patient does apply for the [facility] Adult ICU Electrolyte Replacment Protocol based on the criteria listed below:   1. Is GFR >/= 40 ml/min? yes  Patient's GFR today is >90 2. Is urine output >/= 0.5 ml/kg/hr for the last 6 hours? yes Patient's UOP is 1.1 ml/kg/hr 3. Is BUN < 60 mg/dL? yes  Patient's BUN today is 12 4. Abnormal electrolyte(s):Potassium 5. Ordered repletion with: Potassium per Protocol  Kavin Weckwerth P 10/05/2013 5:40 AM

## 2013-10-05 NOTE — Progress Notes (Signed)
Name: Theresa Castillo MRN: [id number] DOB: [birth year]    ADMISSION DATE:  10/04/2013 CONSULTATION DATE:  10/04/2013  REFERRING MD :  ED PRIMARY SERVICE: MICU  CHIEF COMPLAINT:  AMS , Suicidal   BRIEF PATIENT DESCRIPTION: Recent Suicidal ideation with multiple prescription drug OD and respiratory depression requiring intubation in the ED . UDS Pos for BDZ, amphetamines, opiates all Rx meds Sent suicide email to her son prior    LINES / TUBES: ETT  10/04/2013  CULTURES: P  ANTIBIOTICS: None    SUBJECTIVE: sleepy, of propofol Afebrile Good UO  VITAL SIGNS: Temp:  [95.3 F (35.2 C)-97.9 F (36.6 C)] 97.9 F (36.6 C) (12/29 0725) Pulse Rate:  [79-113] 80 (12/29 0956) Resp:  [0-22] 18 (12/29 0956) BP: (110-158)/(72-101) 130/92 mmHg (12/29 0800) SpO2:  [93 %-100 %] 97 % (12/29 0956) FiO2 (%):  [40 %-100 %] 40 % (12/29 0956) Weight:  [109.9 kg (242 lb 4.6 oz)] 109.9 kg (242 lb 4.6 oz) (12/29 0455) HEMODYNAMICS:   VENTILATOR SETTINGS: Vent Mode:  [-] PSV FiO2 (%):  [40 %-100 %] 40 % Set Rate:  [18 bmp-22 bmp] 18 bmp Vt Set:  [500 mL] 500 mL PEEP:  [5 cmH20] 5 cmH20 Pressure Support:  [10 cmH20] 10 cmH20 Plateau Pressure:  [16 cmH20-21 cmH20] 16 cmH20 INTAKE / OUTPUT: Intake/Output     12/28 0701 - 12/29 0700 12/29 0701 - 12/30 0700   I.V. (mL/kg) 1186.5 (10.8) 22 (0.2)   NG/GT  30   IV Piggyback 200    Total Intake(mL/kg) 1386.5 (12.6) 52 (0.5)   Urine (mL/kg/hr) 1225 40 (0.1)   Emesis/NG output 200    Total Output 1425 40   Net -38.5 +12          PHYSICAL EXAMINATION: General:  Sedated intubated ,NAD Neuro:  Arousable, follows 1 step commands but nods off to sleep HEENT:  atraumatic , no jaundice , dry mucous membranes  Cardiovascular: NS1S2 , regular   Lungs:  CTA bilateral , no wheezing or crackles  Abdomen:  Soft lax +BS , no tenderness . Musculoskeletal:  WNL , normal pulses  Skin:  No rash    LABS:  CBC  Recent Labs Lab  10/04/13 2205 10/04/13 2214 10/05/13 0355  WBC 15.4*  --  15.3*  HGB 13.3 12.9 13.3  HCT 40.0 38.0 39.8  PLT 270  --  269   Coag's  Recent Labs Lab 10/05/13 0125  INR 0.99   BMET  Recent Labs Lab 10/04/13 2205 10/04/13 2214 10/05/13 0125 10/05/13 0355  NA 133* 138 137 137  K 4.0 4.1 3.7 3.5  CL 98 101 103 102  CO2 24  --  23 25  BUN 13 13 13 12   CREATININE 0.75 0.90 0.73 0.68  GLUCOSE 105* 107* 96 87   Electrolytes  Recent Labs Lab 10/04/13 2205 10/05/13 0125 10/05/13 0355  CALCIUM 8.5 8.7 8.8  MG  --   --  2.0  PHOS  --   --  4.0   Sepsis Markers  Recent Labs Lab 10/04/13 2205 10/04/13 2216  LATICACIDVEN 1.9 1.85   ABG  Recent Labs Lab 10/04/13 2158 10/05/13 0349  PHART 7.435 7.427  PCO2ART 36.7 38.6  PO2ART 132.0* 116.0*   Liver Enzymes  Recent Labs Lab 10/04/13 2205 10/05/13 0125  AST 16 17  ALT 12 12  ALKPHOS 102 96  BILITOT 0.2* 0.4  ALBUMIN 3.3* 3.2*   Cardiac Enzymes  Recent Labs Lab 10/05/13 0125  10/05/13 0355  TROPONINI <0.30 <0.30   Glucose  Recent Labs Lab 10/04/13 2124 10/05/13 0726  GLUCAP 108* 84    Imaging Ct Head Wo Contrast  10/04/2013   CLINICAL DATA:  Drug overdose.  Patient is not speaking.  EXAM: CT HEAD WITHOUT CONTRAST  TECHNIQUE: Contiguous axial images were obtained from the base of the skull through the vertex without intravenous contrast.  COMPARISON:  11/02/2008  FINDINGS: Patient is rotated, limiting examination. Endotracheal and OG tubes are in place. Ventricles and sulci appear symmetrical. No mass effect or midline shift. No abnormal extra-axial fluid collections. Gray-white matter junctions are distinct. Basal cisterns are not effaced. No evidence of acute intracranial hemorrhage. No depressed skull fractures. Visualized paranasal sinuses and mastoid air cells are not opacified. No significant change since previous study.  IMPRESSION: No acute intracranial abnormalities demonstrated.    Electronically Signed   By: Burman Nieves M.D.   On: 10/04/2013 23:01   Dg Chest Portable 1 View  10/04/2013   CLINICAL DATA:  Drug overdose.  Intubation.  EXAM: PORTABLE CHEST - 1 VIEW  COMPARISON:  One view chest x-ray 06/26/2013.  FINDINGS: Endotracheal tube tip in satisfactory position projecting approximately 3 cm above the carina. Linear atelectasis in the lung bases bilaterally. Patchy airspace opacity suspected in the right upper lobe. Lungs otherwise clear. No visible pleural effusions. Cardiomediastinal silhouette unremarkable and unchanged.  IMPRESSION: 1. Endotracheal tube tip in satisfactory position projecting approximately 3 cm above the carina. 2. Atelectasis in both lung bases. 3. Possible developing pneumonia in the right upper lobe, query aspiration.   Electronically Signed   By: Hulan Saas M.D.   On: 10/04/2013 22:17     CXR:   ASSESSMENT / PLAN:   47 yrs old CF with hx of depression , Hypothyroidism with recent admission for AMS and poly pharmacy , now found down with multiple empty bottles of medications in the field , depressed MS , GCS around 7 in the ED , minimal response in the ED to 2 mg of Narcan , intubated for airway protection .  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 Extubate when awake , updated son  I have personally obtained a history, examined the patient, evaluated laboratory and imaging results, formulated the assessment and plan and placed orders. CRITICAL CARE: The patient is critically ill with multiple organ systems failure and requires high complexity decision making for assessment and support, frequent evaluation and titration of therapies, application of advanced monitoring technologies and extensive interpretation of multiple databases. Critical Care Time devoted to patient care services described in this note is  40 minutes.    Kathy Breach  Vassie Loll MD. Tonny Bollman. [place] [facility] Pager [phone] If no response call [phone]    10/05/2013, 9:56 AM

## 2013-10-05 NOTE — Progress Notes (Signed)
Dr Vassie Loll updated pt remains on full vent support with RASS -2. Will arouse and follow commands, but otherwise resting. Pt RR 18/18, no further CPAP trials attempted due to lethargy. MD stated do CPAP trial when pt more alert and extubate once more alert/tolerates CPAP. Koren Bound

## 2013-10-05 NOTE — Progress Notes (Signed)
eLink Physician-Brief Progress Note Patient Name: Theresa Castillo DOB: 01/22/1966 MRN: [id number]  Date of Service  10/05/2013   HPI/Events of Note   Patient awake and tolerating pressure support. Able to follow commands now off propofol several hour  eICU Interventions  Plan for extubation. Will require safety sitter and suicide precaution   Intervention Category Major Interventions: Respiratory failure - evaluation and management  [name] S. 10/05/2013, 3:26 PM

## 2013-10-05 NOTE — Progress Notes (Signed)
eLink Physician-Brief Progress Note Patient Name: Theresa Castillo DOB: December 26, 1965 MRN: [id number]  Date of Service  10/05/2013   HPI/Events of Note     eICU Interventions  Psychiatry consult requested this afternoon.    Intervention Category Minor Interventions: Communication with other healthcare providers and/or family  Quaniyah Bugh S. 10/05/2013, 5:10 PM

## 2013-10-05 NOTE — Progress Notes (Signed)
10/05/13 extubated patient per Dr. Delton Coombes orders with Victorino Dike RN Placed patient on 3L [place] sats 99% patient is stable RT will continfue to monitor

## 2013-10-05 NOTE — Progress Notes (Signed)
Utilization Review Completed.Elyce Zollinger T12/29/2014  

## 2013-10-06 ENCOUNTER — Ambulatory Visit: Payer: BC Managed Care – PPO | Admitting: Hematology and Oncology

## 2013-10-06 ENCOUNTER — Ambulatory Visit: Payer: BC Managed Care – PPO

## 2013-10-06 ENCOUNTER — Inpatient Hospital Stay (HOSPITAL_COMMUNITY): Payer: BC Managed Care – PPO

## 2013-10-06 DIAGNOSIS — T50902A Poisoning by unspecified drugs, medicaments and biological substances, intentional self-harm, initial encounter: Secondary | ICD-10-CM

## 2013-10-06 DIAGNOSIS — J690 Pneumonitis due to inhalation of food and vomit: Secondary | ICD-10-CM

## 2013-10-06 LAB — CBC
HCT: 34.3 % — ABNORMAL LOW (ref 36.0–46.0)
MCH: 31.5 pg (ref 26.0–34.0)
MCHC: 33.5 g/dL (ref 30.0–36.0)
Platelets: 274 10*3/uL (ref 150–400)
RDW: 14.4 % (ref 11.5–15.5)
WBC: 17.2 10*3/uL — ABNORMAL HIGH (ref 4.0–10.5)

## 2013-10-06 LAB — URINE CULTURE: Culture: NO GROWTH

## 2013-10-06 LAB — BASIC METABOLIC PANEL
BUN: 10 mg/dL (ref 6–23)
Calcium: 8.7 mg/dL (ref 8.4–10.5)
Chloride: 101 mEq/L (ref 96–112)
Creatinine, Ser: 0.74 mg/dL (ref 0.50–1.10)
GFR calc Af Amer: >90 mL/min (ref >90)
GFR calc non Af Amer: >90 mL/min (ref >90)
Glucose, Bld: 88 mg/dL (ref 70–99)

## 2013-10-06 MED ORDER — BUPROPION HCL ER (XL) 300 MG PO TB24
300.0000 mg | ORAL_TABLET | Freq: Every day | ORAL | Status: DC
  Administered 2013-10-06 – 2013-10-08 (×3): 300 mg via ORAL
  Filled 2013-10-06 (×3): qty 1

## 2013-10-06 MED ORDER — HYDROCODONE-ACETAMINOPHEN 5-325 MG PO TABS
1.0000 | ORAL_TABLET | Freq: Four times a day (QID) | ORAL | Status: DC | PRN
  Administered 2013-10-06 – 2013-10-08 (×3): 1 via ORAL
  Filled 2013-10-06 (×2): qty 1
  Filled 2013-10-06: qty 2

## 2013-10-06 MED ORDER — BIOTENE DRY MOUTH MT LIQD: 15.0000 mL | Freq: Two times a day (BID) | OROMUCOSAL | Status: DC

## 2013-10-06 MED ORDER — PNEUMOCOCCAL VAC POLYVALENT 25 MCG/0.5ML IJ INJ
0.5000 mL | INJECTION | INTRAMUSCULAR | Status: AC | PRN
  Administered 2013-10-08: 0.5 mL via INTRAMUSCULAR

## 2013-10-06 MED ORDER — PHENOL 1.4 % MT LIQD
1.0000 | OROMUCOSAL | Status: DC | PRN
  Filled 2013-10-06: qty 177

## 2013-10-06 MED ORDER — LEVOTHYROXINE SODIUM 50 MCG PO TABS
50.0000 ug | ORAL_TABLET | Freq: Every morning | ORAL | Status: DC
  Administered 2013-10-06 – 2013-10-08 (×3): 50 ug via ORAL
  Filled 2013-10-06 (×3): qty 1

## 2013-10-06 MED ORDER — DOXEPIN HCL 10 MG PO CAPS
10.0000 mg | ORAL_CAPSULE | Freq: Every day | ORAL | Status: DC
  Administered 2013-10-06 – 2013-10-07 (×2): 10 mg via ORAL
  Filled 2013-10-06 (×3): qty 1

## 2013-10-06 MED ORDER — OLANZAPINE 5 MG PO TABS
5.0000 mg | ORAL_TABLET | Freq: Every day | ORAL | Status: DC
  Administered 2013-10-06 – 2013-10-07 (×2): 5 mg via ORAL
  Filled 2013-10-06 (×3): qty 1

## 2013-10-06 MED ORDER — VENLAFAXINE HCL ER 150 MG PO CP24
150.0000 mg | ORAL_CAPSULE | Freq: Every day | ORAL | Status: DC
  Administered 2013-10-07 – 2013-10-08 (×2): 150 mg via ORAL
  Filled 2013-10-06 (×3): qty 1

## 2013-10-06 MED ORDER — DEXTROSE 5 % IV SOLN
2.0000 g | Freq: Three times a day (TID) | INTRAVENOUS | Status: DC
  Administered 2013-10-06 – 2013-10-07 (×4): 2 g via INTRAVENOUS
  Filled 2013-10-06 (×7): qty 2

## 2013-10-06 NOTE — ED Provider Notes (Signed)
I have personally seen and examined the patient and discussed plan of care with the resident.  I was present for entire procedure.  I have reviewed the appropriate documentation on PMH/FH/Soc. History.  I have reviewed the documentation of the resident and agree.  CRITICAL CARE Performed by: Joya Gaskins Total critical care time: 45 Critical care time was exclusive of separately billable procedures and treating other patients. Critical care was necessary to treat or prevent imminent or life-threatening deterioration. Critical care was time spent personally by me on the following activities: development of treatment plan with patient and/or surrogate as well as nursing, discussions with consultants, evaluation of patient's response to treatment, examination of patient, obtaining history from patient or surrogate, ordering and performing treatments and interventions, ordering and review of laboratory studies, ordering and review of radiographic studies, pulse oximetry and re-evaluation of patient's condition.   I discussed care of patient with intensivist Dr. Rene Paci.  He assumed care in the ED.  We discussed possible etiologies.  He will follow APAP levels but will defer NAC treatment for now.  She does have doxepin which is a TCA but no significant QRS derangement noted on arrival, will need monitoring    Joya Gaskins, MD 10/06/13 [phone]

## 2013-10-06 NOTE — Progress Notes (Signed)
[name] in wheelchair on monitor without event. [name] called to make aware- message left.   Theresa Castillo

## 2013-10-06 NOTE — Evaluation (Signed)
Physical Therapy Evaluation Patient Details Name: Theresa Castillo MRN: [id number] DOB: 05/01/1966 Today's Date: 10/06/2013 Time: 5784-6962 PT Time Calculation (min): 22 min  PT Assessment / Plan / Recommendation History of Present Illness  Pt presents via EMS after apparent drug overdose. Per EMS the patient sent out multiple emails about wanting to end her life. [employer] went to the scene to find the patient unresponsive with empty pill bottle. The patient was found with multiple pill bottles with some still filled and some empty. Notably the patient's hydrocodone was empty. EMS gave 2 mg of narcan without significant response. An NPA was placed and EMS assisted ventilations en route. History and ROS limited secondary to the mental status of the patient.   Clinical Impression  Pt mobilized well today, hindered by decr. balance and decr. Activity tolerance and still dependent on oxygen.  Hoping pt can go back home with help of family up to 24 hours initially and HHPT.     PT Assessment  Patient needs continued PT services    Follow Up Recommendations  Home health PT;Supervision/Assistance - 24 hour    Does the patient have the potential to tolerate intense rehabilitation      Barriers to Discharge        Equipment Recommendations  Other (comment) (TBA)    Recommendations for Other Services     Frequency Min 3X/week    Precautions / Restrictions Precautions Precautions: Other (comment) (suicide)   Pertinent Vitals/Pain Sats on 2L at rest 96%.  sats dropped to 85% on RA during gait.  Pt brought up a significant amount of thick green sputum during coughing spell.  Quickly returned to 91% on 2L.       Mobility  Bed Mobility Bed Mobility: Supine to Sit;Sitting - Scoot to Edge of Bed Supine to Sit: 5: Supervision Sitting - Scoot to [place] of Bed: 5: Supervision Transfers Transfers: Sit to Stand;Stand to [employer] Transfers Sit to Stand: 4: Min guard;With upper extremity  assist;From bed Stand to Sit: 4: Min guard;To chair/3-in-1 Stand Pivot Transfers: 4: Min assist Details for Transfer Assistance: moves impulsively; min assist for safety Ambulation/Gait Ambulation/Gait Assistance: 4: Min assist Ambulation Distance (Feet): 250 Feet Assistive device: 1 person hand held assist;Other (Comment) (IV pole) Ambulation/Gait Assistance Details: unsteady with scissoring and wandering to maintain balance Gait Pattern: Step-through pattern;Scissoring;Right circumduction;Wide base of support Stairs: No    Exercises     PT Diagnosis: Abnormality of gait;Generalized weakness  PT Problem List: Decreased strength;Decreased activity tolerance;Decreased balance;Decreased cognition;Decreased knowledge of precautions;Cardiopulmonary status limiting activity PT Treatment Interventions: Gait training;Stair training;Functional mobility training;Therapeutic activities;Balance training;Patient/family education     PT Goals(Current goals can be found in the care plan section) Acute Rehab PT Goals Patient Stated Goal: pt didn't come up with a specific goal, but agreed she wanted to be Independent PT Goal Formulation: With patient Time For Goal Achievement: 10/13/13 Potential to Achieve Goals: Good  Visit Information  Last PT Received On: 10/06/13 Assistance Needed: +2 (for lines) History of Present Illness: Pt presents via EMS after apparent drug overdose. Per EMS the patient sent out multiple emails about wanting to end her life. [employer] went to the scene to find the patient unresponsive with empty pill bottle. The patient was found with multiple pill bottles with some still filled and some empty. Notably the patient's hydrocodone was empty. EMS gave 2 mg of narcan without significant response. An NPA was placed and EMS assisted ventilations en route. History and ROS limited  secondary to the mental status of the patient.        Prior Functioning  Home Living Family/patient  expects to be discharged to:: Private residence Living Arrangements: Alone Available Help at Discharge: Family;Other (Comment) (pt stated her son can help) Type of Home: House Home Access: Stairs to enter [employer] of Steps: 3 Entrance Stairs-Rails: Right;Left Home Layout: One level Home Equipment: None Prior Function Level of Independence: Independent Communication Communication: No difficulties    Cognition  Cognition Arousal/Alertness: Awake/alert Behavior During Therapy: WFL for tasks assessed/performed Overall Cognitive Status: Impaired/Different from baseline Area of Impairment: Orientation;Memory;Safety/judgement Orientation Level: Place;Time;Situation    Extremity/Trunk Assessment Upper Extremity Assessment Upper Extremity Assessment: Defer to OT evaluation Lower Extremity Assessment Lower Extremity Assessment: Generalized weakness   Balance Balance Balance Assessed: Yes Static Standing Balance Static Standing - Balance Support: Right upper extremity supported;Left upper extremity supported;No upper extremity supported Static Standing - Level of Assistance: Other (comment);4: Min assist (min guard) Static Standing - Comment/# of Minutes: tended to list posteriorly with no challenge  End of Session PT - End of Session Activity Tolerance: Patient tolerated treatment well;Other (comment) (limited by decr sats (trial without O2)) Patient left: in chair;with call bell/phone within reach;with nursing/sitter in room Nurse Communication: Mobility status  GP     Theresa Castillo, Eliseo Gum 10/06/2013, 11:32 AM 10/06/2013  [place], PT [phone]  (pager)

## 2013-10-06 NOTE — Consult Note (Signed)
[facility] Face-to-Face Psychiatry Consult   Reason for Consult:  Suicidal attempt Referring Physician:  Dr Tanda Rockers Theresa Castillo is an 47 y.o. female.  Assessment: AXIS I:  Bipolar, Depressed AXIS II:  Deferred AXIS III:   Past Medical History  Diagnosis Date  . Allergy     pineapple abd pain and diarrhea  . Anxiety   . Depression   . Thyroid disease     borderline hypo  . Ulcer     born with stomach ulcer  . Hyperlipidemia   . 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 Chronic pain of right wrist   . Abnormal Pap smear    AXIS IV:  other psychosocial or environmental problems, problems related to social environment and problems with primary support group AXIS V:  11-20 some danger of hurting self or others possible OR occasionally fails to maintain minimal personal hygiene OR gross impairment in communication  Plan:  Recommend psychiatric Inpatient admission when medically cleared.  Subjective:   Theresa Castillo is a 47 y.o. female patient admitted with suicidal attempt.  HPI:  Patient seen in chart reviewed.  Patient is 47 year old Caucasian female who was admitted on the medical floor because of multiple drug overdose and respiratory depression requires intubation.  The patient admitted it was a suicidal attempt.  She took Zyprexa, Effexor and ibuprofen after having an argument with her father.  Patient told she was feeling very depressed, hopeless and helpless but her father mentioned that she is a failure in her life.  Patient see Doreatha Martin for her psychiatric illness and medication management.  She's been seen for her for 6 years.  Patient has one psychiatric hospitalization at [facility] 4 years ago.  Patient admitted feeling very tired, depressed, feelings of hopelessness and continued to endorse suicidal thoughts.  Patient lives alone.  Patient denies any paranoia auditory hallucination but admitted to history of bipolar disorder.  Patient is guarded and did not  provide much information.  She admitted history of smoking marijuana, alcohol and using cocaine.  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 HPI Elements:   Location:  Medical floor. Quality:  poor. Severity:  Moderate.  Past Psychiatric History: Past Medical History  Diagnosis Date  . Allergy     pineapple abd pain and diarrhea  . Anxiety   . Depression   . Thyroid disease     borderline hypo  . Ulcer     born with stomach ulcer  . Hyperlipidemia   . 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 Chronic pain of right wrist   . Abnormal Pap smear     reports that she has been smoking Cigarettes.  She has a 30 pack-year smoking history. She has never used smokeless tobacco. She reports that she drinks alcohol. She reports that she does not use illicit drugs. Family History  Problem Relation Age of Onset  . Colon cancer Father   . Heart disease Father   . Heart attack Father   . Cancer Mother     breast/lukemia  . Cancer Maternal Aunt     breast and thyroid  . Cancer Paternal Aunt     breast  . Cancer Maternal Grandmother     colon     Living Arrangements: Alone   Abuse/Neglect [facility]) Physical Abuse: Denies, provider concerned (Comment) Verbal Abuse: Denies, provider concerned (Comment) Sexual Abuse: Denies Allergies:   Allergies  Allergen Reactions  .  Ammonia Other (See Comments)    Severe migraines  . Amoxicillin Other (See Comments)    syncope  . Topamax Other (See Comments)    Caused neurological deficits, confusion, bad intentions   . Pineapple Nausea Only  . Latex Rash and Other (See Comments)    Cracking of skin  . Trazodone And Nefazodone Other (See Comments)    unknown    ACT Assessment Complete:  No:   Past Psychiatric History: Diagnosis:  Bipolar disorder   Hospitalizations:  Yes   Outpatient Care:  Yes   Substance Abuse Care:  Yes   Self-Mutilation:  Unknown   Suicidal Attempts:  Yes   Homicidal Behaviors:  Unknown    Violent Behaviors:  Unknown     Place of Residence:  Lives by herself Marital Status:  Single Employed/Unemployed:  Unknown Education:  accounting Family Supports:  Limited Objective: Blood pressure 98/51, pulse 96, temperature 98.6 F (37 C), temperature source Oral, resp. rate 23, height 5\' 4"  (1.626 m), weight 238 lb 15.7 oz (108.4 kg), SpO2 98.00%.Body mass index is 41 kg/(m^2). Results for orders placed during the hospital encounter of 10/04/13 (from the past 72 hour(s))  GLUCOSE, CAPILLARY     Status: Abnormal   Collection Time    10/04/13  9:24 PM      Result Value Range   Glucose-Capillary 108 (*) 70 - 99 mg/dL   Comment 1 Documented in Chart     Comment 2 Notify RN     Comment 3 Call MD NNP PA CNM    POCT I-STAT 3, BLOOD GAS (G3+)     Status: Abnormal   Collection Time    10/04/13  9:58 PM      Result Value Range   pH, Arterial 7.435  7.350 - 7.450   pCO2 arterial 36.7  35.0 - 45.0 mmHg   pO2, Arterial 132.0 (*) 80.0 - 100.0 mmHg   Bicarbonate 24.7 (*) 20.0 - 24.0 mEq/L   TCO2 26  0 - 100 mmol/L   O2 Saturation 99.0     Acid-Base Excess 1.0  0.0 - 2.0 mmol/L   Patient temperature 98.6 F     Collection site RADIAL, ALLEN'S TEST ACCEPTABLE     Drawn by Operator     Sample type ARTERIAL    CBC WITH DIFFERENTIAL     Status: Abnormal   Collection Time    10/04/13 10:05 PM      Result Value Range   WBC 15.4 (*) 4.0 - 10.5 K/uL   RBC 4.19  3.87 - 5.11 MIL/uL   Hemoglobin 13.3  12.0 - 15.0 g/dL   HCT 16.1  09.6 - 04.5 %   MCV 95.5  78.0 - 100.0 fL   MCH 31.7  26.0 - 34.0 pg   MCHC 33.3  30.0 - 36.0 g/dL   RDW 40.9  81.1 - 91.4 %   Platelets 270  150 - 400 K/uL   Neutrophils Relative % 72  43 - 77 %   Neutro Abs 11.0 (*) 1.7 - 7.7 K/uL   Lymphocytes Relative 22  12 - 46 %   Lymphs Abs 3.4  0.7 - 4.0 K/uL   Monocytes Relative 4  3 - 12 %   Monocytes Absolute 0.7  0.1 - 1.0 K/uL   Eosinophils Relative 2  0 - 5 %   Eosinophils Absolute 0.3  0.0 - 0.7 K/uL   Basophils Relative 0  0 - 1 %    Basophils Absolute 0.1  0.0 - 0.1 K/uL  SALICYLATE LEVEL     Status: Abnormal   Collection Time    10/04/13 10:05 PM      Result Value Range   Salicylate Lvl <2.0 (*) 2.8 - 20.0 mg/dL  ACETAMINOPHEN LEVEL     Status: None   Collection Time    10/04/13 10:05 PM      Result Value Range   Acetaminophen (Tylenol), Serum 27.2  10 - 30 ug/mL   Comment:            THERAPEUTIC CONCENTRATIONS VARY     SIGNIFICANTLY. A RANGE OF 10-30     ug/mL MAY BE AN EFFECTIVE     CONCENTRATION FOR MANY PATIENTS.     HOWEVER, SOME ARE BEST TREATED     AT CONCENTRATIONS OUTSIDE THIS     RANGE.     ACETAMINOPHEN CONCENTRATIONS     >150 ug/mL AT 4 HOURS AFTER     INGESTION AND >50 ug/mL AT 12     HOURS AFTER INGESTION ARE     OFTEN ASSOCIATED WITH TOXIC     REACTIONS.  COMPREHENSIVE METABOLIC PANEL     Status: Abnormal   Collection Time    10/04/13 10:05 PM      Result Value Range   Sodium 133 (*) 135 - 145 mEq/L   Potassium 4.0  3.5 - 5.1 mEq/L   Chloride 98  96 - 112 mEq/L   CO2 24  19 - 32 mEq/L   Glucose, Bld 105 (*) 70 - 99 mg/dL   BUN 13  6 - 23 mg/dL   Creatinine, Ser 1.61  0.50 - 1.10 mg/dL   Calcium 8.5  8.4 - 09.6 mg/dL   Total Protein 7.0  6.0 - 8.3 g/dL   Albumin 3.3 (*) 3.5 - 5.2 g/dL   AST 16  0 - 37 U/L   ALT 12  0 - 35 U/L   Alkaline Phosphatase 102  39 - 117 U/L   Total Bilirubin 0.2 (*) 0.3 - 1.2 mg/dL   GFR calc non Af Amer >90  >90 mL/min   GFR calc Af Amer >90  >90 mL/min   Comment: (NOTE)     The eGFR has been calculated using the CKD EPI equation.     This calculation has not been validated in all clinical situations.     eGFR's persistently <90 mL/min signify possible Chronic Kidney     Disease.  ETHANOL     Status: None   Collection Time    10/04/13 10:05 PM      Result Value Range   Alcohol, Ethyl (B) <11  0 - 11 mg/dL   Comment:            LOWEST DETECTABLE LIMIT FOR     SERUM ALCOHOL IS 11 mg/dL     FOR MEDICAL PURPOSES ONLY  OSMOLALITY     Status: None    Collection Time    10/04/13 10:05 PM      Result Value Range   Osmolality 284  275 - 300 mOsm/kg   Comment: Performed at [facility]  TSH     Status: Abnormal   Collection Time    10/04/13 10:05 PM      Result Value Range   TSH 5.215 (*) 0.350 - 4.500 uIU/mL   Comment: Performed at [facility]  LACTIC ACID, PLASMA     Status: None   Collection Time    10/04/13 10:05 PM  Result Value Range   Lactic Acid, Venous 1.9  0.5 - 2.2 mmol/L  POCT I-STAT, CHEM 8     Status: Abnormal   Collection Time    10/04/13 10:14 PM      Result Value Range   Sodium 138  135 - 145 mEq/L   Potassium 4.1  3.5 - 5.1 mEq/L   Chloride 101  96 - 112 mEq/L   BUN 13  6 - 23 mg/dL   Creatinine, Ser 1.19  0.50 - 1.10 mg/dL   Glucose, Bld 147 (*) 70 - 99 mg/dL   Calcium, Ion 8.29  5.62 - 1.23 mmol/L   TCO2 26  0 - 100 mmol/L   Hemoglobin 12.9  12.0 - 15.0 g/dL   HCT 13.0  86.5 - 78.4 %  URINE CULTURE     Status: None   Collection Time    10/04/13 10:15 PM      Result Value Range   Specimen Description URINE, CATHETERIZED     Special Requests NONE     Culture  Setup Time       Value: 10/05/2013 09:15     Performed at [facility] Count       Value: NO GROWTH     Performed at [facility]   Culture       Value: NO GROWTH     Performed at [facility]   Report Status 10/06/2013 FINAL    URINALYSIS, ROUTINE W REFLEX MICROSCOPIC     Status: Abnormal   Collection Time    10/04/13 10:15 PM      Result Value Range   Color, Urine YELLOW  YELLOW   APPearance CLOUDY (*) CLEAR   Specific Gravity, Urine 1.017  1.005 - 1.030   pH 5.5  5.0 - 8.0   Glucose, UA NEGATIVE  NEGATIVE mg/dL   Hgb urine dipstick NEGATIVE  NEGATIVE   Bilirubin Urine NEGATIVE  NEGATIVE   Ketones, ur NEGATIVE  NEGATIVE mg/dL   Protein, ur 30 (*) NEGATIVE mg/dL   Urobilinogen, UA 0.2  0.0 - 1.0 mg/dL   Nitrite NEGATIVE  NEGATIVE   Leukocytes, UA NEGATIVE  NEGATIVE  POCT I-STAT  TROPONIN I     Status: None   Collection Time    10/04/13 10:15 PM      Result Value Range   Troponin i, poc 0.00  0.00 - 0.08 ng/mL   Comment 3            Comment: Due to the release kinetics of cTnI,     a negative result within the first hours     of the onset of symptoms does not rule out     myocardial infarction with certainty.     If myocardial infarction is still suspected,     repeat the test at appropriate intervals.  URINE MICROSCOPIC-ADD ON     Status: Abnormal   Collection Time    10/04/13 10:15 PM      Result Value Range   Squamous Epithelial / LPF RARE  RARE   WBC, UA 0-2  <3 WBC/hpf   RBC / HPF 0-2  <3 RBC/hpf   Bacteria, UA RARE  RARE   Casts HYALINE CASTS (*) NEGATIVE   Comment: GRANULAR CAST  CG4 I-STAT (LACTIC ACID)     Status: None   Collection Time    10/04/13 10:16 PM      Result Value Range   Lactic Acid, Venous 1.85  0.5 - 2.2 mmol/L  URINE RAPID DRUG SCREEN (HOSP PERFORMED)     Status: Abnormal   Collection Time    10/04/13 10:37 PM      Result Value Range   Opiates POSITIVE (*) NONE DETECTED   Cocaine NONE DETECTED  NONE DETECTED   Benzodiazepines POSITIVE (*) NONE DETECTED   Amphetamines POSITIVE (*) NONE DETECTED   Tetrahydrocannabinol NONE DETECTED  NONE DETECTED   Barbiturates NONE DETECTED  NONE DETECTED   Comment:            DRUG SCREEN FOR MEDICAL PURPOSES     ONLY.  IF CONFIRMATION IS NEEDED     FOR ANY PURPOSE, NOTIFY LAB     WITHIN 5 DAYS.                LOWEST DETECTABLE LIMITS     FOR URINE DRUG SCREEN     Drug Class       Cutoff (ng/mL)     Amphetamine      1000     Barbiturate      200     Benzodiazepine   200     Tricyclics       300     Opiates          300     Cocaine          300     THC              50  MRSA PCR SCREENING     Status: None   Collection Time    10/04/13 10:58 PM      Result Value Range   MRSA by PCR NEGATIVE  NEGATIVE   Comment:            The GeneXpert MRSA Assay (FDA     approved for NASAL  specimens     only), is one component of a     comprehensive MRSA colonization     surveillance program. It is not     intended to diagnose MRSA     infection nor to guide or     monitor treatment for     MRSA infections.  AMMONIA     Status: None   Collection Time    10/04/13 11:10 PM      Result Value Range   Ammonia 22  11 - 60 umol/L  COMPREHENSIVE METABOLIC PANEL     Status: Abnormal   Collection Time    10/05/13  1:25 AM      Result Value Range   Sodium 137  135 - 145 mEq/L   Potassium 3.7  3.5 - 5.1 mEq/L   Chloride 103  96 - 112 mEq/L   CO2 23  19 - 32 mEq/L   Glucose, Bld 96  70 - 99 mg/dL   BUN 13  6 - 23 mg/dL   Creatinine, Ser 0.86  0.50 - 1.10 mg/dL   Calcium 8.7  8.4 - 57.8 mg/dL   Total Protein 6.8  6.0 - 8.3 g/dL   Albumin 3.2 (*) 3.5 - 5.2 g/dL   AST 17  0 - 37 U/L   ALT 12  0 - 35 U/L   Alkaline Phosphatase 96  39 - 117 U/L   Total Bilirubin 0.4  0.3 - 1.2 mg/dL   GFR calc non Af Amer >90  >90 mL/min   GFR calc Af Amer >90  >90 mL/min   Comment: (NOTE)  The eGFR has been calculated using the CKD EPI equation.     This calculation has not been validated in all clinical situations.     eGFR's persistently <90 mL/min signify possible Chronic Kidney     Disease.  ACETAMINOPHEN LEVEL     Status: None   Collection Time    10/05/13  1:25 AM      Result Value Range   Acetaminophen (Tylenol), Serum <15.0  10 - 30 ug/mL   Comment:            THERAPEUTIC CONCENTRATIONS VARY     SIGNIFICANTLY. A RANGE OF 10-30     ug/mL MAY BE AN EFFECTIVE     CONCENTRATION FOR MANY PATIENTS.     HOWEVER, SOME ARE BEST TREATED     AT CONCENTRATIONS OUTSIDE THIS     RANGE.     ACETAMINOPHEN CONCENTRATIONS     >150 ug/mL AT 4 HOURS AFTER     INGESTION AND >50 ug/mL AT 12     HOURS AFTER INGESTION ARE     OFTEN ASSOCIATED WITH TOXIC     REACTIONS.  PROTIME-INR     Status: None   Collection Time    10/05/13  1:25 AM      Result Value Range   Prothrombin Time 12.9   11.6 - 15.2 seconds   INR 0.99  0.00 - 1.49  TROPONIN I     Status: None   Collection Time    10/05/13  1:25 AM      Result Value Range   Troponin I <0.30  <0.30 ng/mL   Comment:            Due to the release kinetics of cTnI,     a negative result within the first hours     of the onset of symptoms does not rule out     myocardial infarction with certainty.     If myocardial infarction is still suspected,     repeat the test at appropriate intervals.  TRIGLYCERIDES     Status: None   Collection Time    10/05/13  1:25 AM      Result Value Range   Triglycerides 149  <150 mg/dL  BLOOD GAS, ARTERIAL     Status: Abnormal   Collection Time    10/05/13  3:49 AM      Result Value Range   FIO2 0.40     Delivery systems VENTILATOR     Mode PRESSURE REGULATED VOLUME CONTROL     VT 500     Rate 18     Peep/cpap 5.0     pH, Arterial 7.427  7.350 - 7.450   pCO2 arterial 38.6  35.0 - 45.0 mmHg   pO2, Arterial 116.0 (*) 80.0 - 100.0 mmHg   Bicarbonate 25.0 (*) 20.0 - 24.0 mEq/L   TCO2 26.1  0 - 100 mmol/L   Acid-Base Excess 1.0  0.0 - 2.0 mmol/L   O2 Saturation 98.7     Patient temperature 98.6     Collection site RIGHT RADIAL     Drawn by [id number]     Sample type ARTERIAL DRAW     Allens test (pass/fail) PASS  PASS  CBC     Status: Abnormal   Collection Time    10/05/13  3:55 AM      Result Value Range   WBC 15.3 (*) 4.0 - 10.5 K/uL   RBC 4.22  3.87 - 5.11 MIL/uL   Hemoglobin  13.3  12.0 - 15.0 g/dL   HCT 16.1  09.6 - 04.5 %   MCV 94.3  78.0 - 100.0 fL   MCH 31.5  26.0 - 34.0 pg   MCHC 33.4  30.0 - 36.0 g/dL   RDW 40.9  81.1 - 91.4 %   Platelets 269  150 - 400 K/uL  BASIC METABOLIC PANEL     Status: None   Collection Time    10/05/13  3:55 AM      Result Value Range   Sodium 137  135 - 145 mEq/L   Potassium 3.5  3.5 - 5.1 mEq/L   Chloride 102  96 - 112 mEq/L   CO2 25  19 - 32 mEq/L   Glucose, Bld 87  70 - 99 mg/dL   BUN 12  6 - 23 mg/dL   Creatinine, Ser 7.82  0.50 -  1.10 mg/dL   Calcium 8.8  8.4 - 95.6 mg/dL   GFR calc non Af Amer >90  >90 mL/min   GFR calc Af Amer >90  >90 mL/min   Comment: (NOTE)     The eGFR has been calculated using the CKD EPI equation.     This calculation has not been validated in all clinical situations.     eGFR's persistently <90 mL/min signify possible Chronic Kidney     Disease.  MAGNESIUM     Status: None   Collection Time    10/05/13  3:55 AM      Result Value Range   Magnesium 2.0  1.5 - 2.5 mg/dL  PHOSPHORUS     Status: None   Collection Time    10/05/13  3:55 AM      Result Value Range   Phosphorus 4.0  2.3 - 4.6 mg/dL  TROPONIN I     Status: None   Collection Time    10/05/13  3:55 AM      Result Value Range   Troponin I <0.30  <0.30 ng/mL   Comment:            Due to the release kinetics of cTnI,     a negative result within the first hours     of the onset of symptoms does not rule out     myocardial infarction with certainty.     If myocardial infarction is still suspected,     repeat the test at appropriate intervals.  GLUCOSE, CAPILLARY     Status: None   Collection Time    10/05/13  7:26 AM      Result Value Range   Glucose-Capillary 84  70 - 99 mg/dL  GLUCOSE, CAPILLARY     Status: None   Collection Time    10/05/13 11:54 AM      Result Value Range   Glucose-Capillary 88  70 - 99 mg/dL  BASIC METABOLIC PANEL     Status: None   Collection Time    10/06/13  3:54 AM      Result Value Range   Sodium 139  137 - 147 mEq/L   Potassium 4.1  3.7 - 5.3 mEq/L   Chloride 101  96 - 112 mEq/L   CO2 25  19 - 32 mEq/L   Glucose, Bld 88  70 - 99 mg/dL   BUN 10  6 - 23 mg/dL   Creatinine, Ser 2.13  0.50 - 1.10 mg/dL   Calcium 8.7  8.4 - 08.6 mg/dL   GFR calc non Af Amer >90  >  90 mL/min   GFR calc Af Amer >90  >90 mL/min   Comment: (NOTE)     The eGFR has been calculated using the CKD EPI equation.     This calculation has not been validated in all clinical situations.     eGFR's persistently <90  mL/min signify possible Chronic Kidney     Disease.  CBC     Status: Abnormal   Collection Time    10/06/13  3:54 AM      Result Value Range   WBC 17.2 (*) 4.0 - 10.5 K/uL   RBC 3.65 (*) 3.87 - 5.11 MIL/uL   Hemoglobin 11.5 (*) 12.0 - 15.0 g/dL   HCT 40.9 (*) 81.1 - 91.4 %   MCV 94.0  78.0 - 100.0 fL   MCH 31.5  26.0 - 34.0 pg   MCHC 33.5  30.0 - 36.0 g/dL   RDW 78.2  95.6 - 21.3 %   Platelets 274  150 - 400 K/uL   Labs are reviewed.  Current Facility-Administered Medications  Medication Dose Route Frequency Provider Last Rate Last Dose  . 0.9 %  sodium chloride infusion  250 mL Intravenous PRN Gillermina Phy, MD 20 mL/hr at 10/05/13 0700 250 mL at 10/05/13 0700  . buPROPion (WELLBUTRIN XL) 24 hr tablet 300 mg  300 mg Oral Daily Oretha Milch, MD   300 mg at 10/06/13 1337  . cefTAZidime (FORTAZ) 2 g in dextrose 5 % 50 mL IVPB  2 g Intravenous Q8H Gillermina Phy, MD   2 g at 10/06/13 1337  . doxepin (SINEQUAN) capsule 10 mg  10 mg Oral QHS Oretha Milch, MD      . heparin injection 5,000 Units  5,000 Units Subcutaneous Q8H Gillermina Phy, MD   5,000 Units at 10/06/13 1344  . HYDROcodone-acetaminophen (NORCO/VICODIN) 5-325 MG per tablet 1 tablet  1 tablet Oral Q6H PRN Oretha Milch, MD   1 tablet at 10/06/13 1337  . levothyroxine (SYNTHROID, LEVOTHROID) tablet 50 mcg  50 mcg Oral q morning - 10a Oretha Milch, MD   50 mcg at 10/06/13 1337  . OLANZapine (ZYPREXA) tablet 5 mg  5 mg Oral QHS Oretha Milch, MD      . phenol (CHLORASEPTIC) mouth spray 1 spray  1 spray Mouth/Throat PRN Oretha Milch, MD      . pneumococcal 23 valent vaccine (PNU-IMMUNE) injection 0.5 mL  0.5 mL Intramuscular Prior to discharge Oretha Milch, MD      . Melene Muller ON 10/07/2013] venlafaxine XR (EFFEXOR-XR) 24 hr capsule 150 mg  150 mg Oral Q breakfast Oretha Milch, MD        Psychiatric Specialty Exam:     Blood pressure 98/51, pulse 96, temperature 98.6 F (37 C), temperature source Oral, resp. rate 23, height 5'  4" (1.626 m), weight 238 lb 15.7 oz (108.4 kg), SpO2 98.00%.Body mass index is 41 kg/(m^2).  General Appearance: Casual and Guarded  Eye Contact::  Fair  Speech:  Slow  Volume:  Decreased  Mood:  Dysphoric  Affect:  Constricted and Depressed  Thought Process:  Loose  Orientation:  Full (Time, Place, and Person)  Thought Content:  Rumination  Suicidal Thoughts:  Yes.  with intent/plan  Homicidal Thoughts:  No  Memory:  Immediate;   Fair Recent;   Fair Remote;   Fair  Judgement:  Impaired  Insight:  Lacking  Psychomotor Activity:  Decreased  Concentration:  Fair  Recall:  Fair  Akathisia:  No  Handed:  Right  AIMS (if indicated):     Assets:  Housing  Sleep:      Treatment Plan Summary: Patient requires inpatient psychiatric services for stabilization Continue sitter for patient's safety.  Transfer to behavioral Center when patient is medically cleared.  Patient is taking Wellbutrin and Zyprexa and denies any side effects.  Please call [phone] if any further questions.  Lamarius Dirr T. 10/06/2013 5:17 PM

## 2013-10-06 NOTE — Progress Notes (Signed)
Name: Theresa Castillo MRN: [id number] DOB: [birth year]    ADMISSION DATE:  10/04/2013 CONSULTATION DATE:  10/04/2013  REFERRING MD :  ED PRIMARY SERVICE: MICU  CHIEF COMPLAINT:  AMS , Suicidal   BRIEF PATIENT DESCRIPTION: Recent Suicidal ideation with multiple prescription drug OD and respiratory depression requiring intubation in the ED . UDS Pos for BDZ, amphetamines, opiates all Rx meds Sent suicide email to her son prior    LINES / TUBES: ETT  10/04/2013 >> 12/29  CULTURES: P  ANTIBIOTICS: None    SUBJECTIVE:  Afebrile Good UO Denies CP, dyspnea  VITAL SIGNS: Temp:  [98.3 F (36.8 C)-99.8 F (37.7 C)] 98.3 F (36.8 C) (12/30 1159) Pulse Rate:  [88-116] 94 (12/30 1100) Resp:  [12-29] 24 (12/30 1100) BP: (96-167)/(55-140) 98/75 mmHg (12/30 1100) SpO2:  [91 %-99 %] 94 % (12/30 1100) FiO2 (%):  [40 %] 40 % (12/29 1502) Weight:  [108.4 kg (238 lb 15.7 oz)] 108.4 kg (238 lb 15.7 oz) (12/30 0500) HEMODYNAMICS:   VENTILATOR SETTINGS: Vent Mode:  [-] PSV FiO2 (%):  [40 %] 40 % Set Rate:  [18 bmp] 18 bmp Vt Set:  [500 mL] 500 mL PEEP:  [5 cmH20] 5 cmH20 Pressure Support:  [5 cmH20] 5 cmH20 Plateau Pressure:  [14 cmH20] 14 cmH20 INTAKE / OUTPUT: Intake/Output     12/29 0701 - 12/30 0700 12/30 0701 - 12/31 0700   P.O. 240    I.V. (mL/kg) 1617 (14.9) 300 (2.8)   NG/GT 30    IV Piggyback     Total Intake(mL/kg) 1887 (17.4) 300 (2.8)   Urine (mL/kg/hr) 1685 (0.6) 1000 (1.8)   Emesis/NG output 100 (0)    Total Output 1785 1000   Net +102 -700          PHYSICAL EXAMINATION: General: no distress Neuro:  Alert, interactive HEENT:  atraumatic , no jaundice , dry mucous membranes  Cardiovascular: NS1S2 , regular   Lungs:  Decreased rt base , no wheezing or crackles  Abdomen:  Soft lax +BS , no tenderness . Musculoskeletal:  WNL , normal pulses  Skin:  No rash    LABS:  CBC  Recent Labs Lab 10/04/13 2205 10/04/13 2214 10/05/13 0355  10/06/13 0354  WBC 15.4*  --  15.3* 17.2*  HGB 13.3 12.9 13.3 11.5*  HCT 40.0 38.0 39.8 34.3*  PLT 270  --  269 274   Coag's  Recent Labs Lab 10/05/13 0125  INR 0.99   BMET  Recent Labs Lab 10/05/13 0125 10/05/13 0355 10/06/13 0354  NA 137 137 139  K 3.7 3.5 4.1  CL 103 102 101  CO2 23 25 25   BUN 13 12 10   CREATININE 0.73 0.68 0.74  GLUCOSE 96 87 88   Electrolytes  Recent Labs Lab 10/05/13 0125 10/05/13 0355 10/06/13 0354  CALCIUM 8.7 8.8 8.7  MG  --  2.0  --   PHOS  --  4.0  --    Sepsis Markers  Recent Labs Lab 10/04/13 2205 10/04/13 2216  LATICACIDVEN 1.9 1.85   ABG  Recent Labs Lab 10/04/13 2158 10/05/13 0349  PHART 7.435 7.427  PCO2ART 36.7 38.6  PO2ART 132.0* 116.0*   Liver Enzymes  Recent Labs Lab 10/04/13 2205 10/05/13 0125  AST 16 17  ALT 12 12  ALKPHOS 102 96  BILITOT 0.2* 0.4  ALBUMIN 3.3* 3.2*   Cardiac Enzymes  Recent Labs Lab 10/05/13 0125 10/05/13 0355  TROPONINI <0.30 <0.30  Glucose  Recent Labs Lab 10/04/13 2124 10/05/13 0726 10/05/13 1154  GLUCAP 108* 84 88    Imaging Ct Head Wo Contrast  10/04/2013   CLINICAL DATA:  Drug overdose.  Patient is not speaking.  EXAM: CT HEAD WITHOUT CONTRAST  TECHNIQUE: Contiguous axial images were obtained from the base of the skull through the vertex without intravenous contrast.  COMPARISON:  11/02/2008  FINDINGS: Patient is rotated, limiting examination. Endotracheal and OG tubes are in place. Ventricles and sulci appear symmetrical. No mass effect or midline shift. No abnormal extra-axial fluid collections. Gray-white matter junctions are distinct. Basal cisterns are not effaced. No evidence of acute intracranial hemorrhage. No depressed skull fractures. Visualized paranasal sinuses and mastoid air cells are not opacified. No significant change since previous study.  IMPRESSION: No acute intracranial abnormalities demonstrated.   Electronically Signed   By: Burman Nieves M.D.   On: 10/04/2013 23:01   Dg Chest Port 1 View  10/06/2013   CLINICAL DATA:  Aspiration pneumonia  EXAM: PORTABLE CHEST - 1 VIEW  COMPARISON:  10/04/2013  FINDINGS: Collapse/ consolidation in the right base is assistant with elevation of the right hemidiaphragm. There is patchy atelectasis or infiltrate in the left base. Cardiopericardial silhouette is upper normal in size to mildly enlarged. Endotracheal tube has been removed in the interval. The volume loss in the right lung retracts the trachea to the right. Telemetry leads overlie the chest.  IMPRESSION: Collapse/ consolidation in the right lung base with patchy atelectasis or infiltrate in the left lower lobe.   Electronically Signed   By: Kennith Center M.D.   On: 10/06/2013 08:23   Dg Chest Portable 1 View  10/04/2013   CLINICAL DATA:  Drug overdose.  Intubation.  EXAM: PORTABLE CHEST - 1 VIEW  COMPARISON:  One view chest x-ray 06/26/2013.  FINDINGS: Endotracheal tube tip in satisfactory position projecting approximately 3 cm above the carina. Linear atelectasis in the lung bases bilaterally. Patchy airspace opacity suspected in the right upper lobe. Lungs otherwise clear. No visible pleural effusions. Cardiomediastinal silhouette unremarkable and unchanged.  IMPRESSION: 1. Endotracheal tube tip in satisfactory position projecting approximately 3 cm above the carina. 2. Atelectasis in both lung bases. 3. Possible developing pneumonia in the right upper lobe, query aspiration.   Electronically Signed   By: Hulan Saas M.D.   On: 10/04/2013 22:17     CXR:   ASSESSMENT / PLAN:   47 yrs old CF with hx of depression , Hypothyroidism with recent admission for AMS and poly pharmacy , now found down with multiple empty bottles of medications in the field , depressed MS , GCS around 7 in the ED , minimal response in the ED to 2 mg of Narcan , intubated for airway protection .  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    Cyril Mourning MD. Tonny Bollman. [facility] Pager [phone] If no response call [phone]    10/06/2013, 12:04 PM

## 2013-10-06 NOTE — Progress Notes (Signed)
ANTIBIOTIC CONSULT NOTE - INITIAL  Pharmacy Consult for ceftazidime Indication: pneumonia  Allergies  Allergen Reactions  . Ammonia Other (See Comments)    Severe migraines  . Amoxicillin Other (See Comments)    syncope  . Topamax Other (See Comments)    Caused neurological deficits, confusion, bad intentions   . Pineapple Nausea Only  . Latex Rash and Other (See Comments)    Cracking of skin  . Trazodone And Nefazodone Other (See Comments)    unknown    Patient Measurements: Height: 5\' 4"  (162.6 cm) Weight: 238 lb 15.7 oz (108.4 kg) IBW/kg (Calculated) : 54.7   Vital Signs: Temp: 98.3 F (36.8 C) (12/30 1159) Temp src: Oral (12/30 1159) BP: 103/61 mmHg (12/30 1200) Pulse Rate: 96 (12/30 1200) Intake/Output from previous day: 12/29 0701 - 12/30 0700 In: 1887 [P.O.:240; I.V.:1617; NG/GT:30] Out: 1785 [Urine:1685; Emesis/NG output:100] Intake/Output from this shift: Total I/O In: 375 [I.V.:375] Out: 1075 [Urine:1075]  Labs:  Recent Labs  10/04/13 2205 10/04/13 2214 10/05/13 0125 10/05/13 0355 10/06/13 0354  WBC 15.4*  --   --  15.3* 17.2*  HGB 13.3 12.9  --  13.3 11.5*  PLT 270  --   --  269 274  CREATININE 0.75 0.90 0.73 0.68 0.74   Estimated Creatinine Clearance: 102.3 ml/min (by C-G formula based on Cr of 0.74). No results found for this basename: Rolm Gala, VANCORANDOM, GENTTROUGH, GENTPEAK, GENTRANDOM, TOBRATROUGH, TOBRAPEAK, TOBRARND, AMIKACINPEAK, AMIKACINTROU, AMIKACIN,  in the last 72 hours   Microbiology: Recent Results (from the past 720 hour(s))  URINE CULTURE     Status: None   Collection Time    10/04/13 10:15 PM      Result Value Range Status   Specimen Description URINE, CATHETERIZED   Final   Special Requests NONE   Final   Culture  Setup Time     Final   Value: 10/05/2013 09:15     Performed at [facility] Count     Final   Value: NO GROWTH     Performed at [facility]   Culture     Final    Value: NO GROWTH     Performed at [facility]   Report Status 10/06/2013 FINAL   Final  MRSA PCR SCREENING     Status: None   Collection Time    10/04/13 10:58 PM      Result Value Range Status   MRSA by PCR NEGATIVE  NEGATIVE Final   Comment:            The GeneXpert MRSA Assay (FDA     approved for NASAL specimens     only), is one component of a     comprehensive MRSA colonization     surveillance program. It is not     intended to diagnose MRSA     infection nor to guide or     monitor treatment for     MRSA infections.    Medical History: Past Medical History  Diagnosis Date  . Allergy     pineapple abd pain and diarrhea  . Anxiety   . Depression   . Thyroid disease     borderline hypo  . Ulcer     born with stomach ulcer  . Hyperlipidemia   . 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 Chronic pain of right wrist   . Abnormal Pap smear     Medications:  Prescriptions prior  to admission  Medication Sig Dispense Refill  . ALPRAZolam (XANAX) 1 MG tablet Take 0.5-1 mg by mouth at bedtime.       Marland Kitchen amphetamine-dextroamphetamine (ADDERALL) 10 MG tablet Take 10 mg by mouth 2 (two) times daily with a meal.       . amphetamine-dextroamphetamine (ADDERALL) 20 MG tablet Take 20 mg by mouth every morning.      Marland Kitchen asenapine (SAPHRIS) 5 MG SUBL 24 hr tablet Place 5 mg under the tongue 2 (two) times daily.      . benzonatate (TESSALON) 200 MG capsule Take 200 mg by mouth 3 (three) times daily.      Marland Kitchen buPROPion (WELLBUTRIN XL) 150 MG 24 hr tablet Take 150 mg by mouth daily.      Marland Kitchen buPROPion (WELLBUTRIN XL) 300 MG 24 hr tablet Take 300 mg by mouth daily.      Marland Kitchen CRANBERRY PO Take 4,200 mg by mouth daily.      Marland Kitchen doxepin (SINEQUAN) 10 MG capsule Take 10-20 mg by mouth at bedtime.      . folic acid (FOLVITE) 1 MG tablet Take 2 mg by mouth every morning.       Marland Kitchen HYDROcodone-acetaminophen (NORCO) 7.5-325 MG per tablet Take 1 tablet by mouth every 6 (six) hours as needed for  moderate pain.      Marland Kitchen HYDROcodone-acetaminophen (NORCO/VICODIN) 5-325 MG per tablet Take 1 tablet by mouth every 6 (six) hours as needed for moderate pain.      Marland Kitchen ibuprofen (ADVIL,MOTRIN) 800 MG tablet Take 800 mg by mouth every 8 (eight) hours as needed for moderate pain.      Marland Kitchen levothyroxine (SYNTHROID, LEVOTHROID) 50 MCG tablet Take 50 mcg by mouth every morning.      . loratadine (CLARITIN) 10 MG tablet Take 10 mg by mouth daily.      . meloxicam (MOBIC) 15 MG tablet Take 15 mg by mouth daily.      . naproxen sodium (ANAPROX) 550 MG tablet Take 550 mg by mouth 2 (two) times daily with a meal.      . OLANZapine (ZYPREXA) 5 MG tablet Take 5 mg by mouth at bedtime.      . Omega-3 Fatty Acids (FISH OIL PO) Take 3,600 Units by mouth daily.      Marland Kitchen venlafaxine XR (EFFEXOR-XR) 150 MG 24 hr capsule Take 150 mg by mouth daily with breakfast.       Assessment: 47 yo lady s/p multiple drug OD and resp depression requiring intubation.  She will start on ceftazidime for suspected aspiration PNA.  (Has allergy listed to amoxicillin- syncope) She is now extubated on 3L02.  CrCl >100 ml/min  Goal of Therapy:  Eradication of infection  Plan:  Fortaz 2gm IV q8 hours F/u renal function, cultures and clinical course.  Theresa Castillo 10/06/2013,12:15 PM

## 2013-10-06 NOTE — Progress Notes (Signed)
CSW received consult for patient about home environement. CSW spoke to RN at rounds and discussed social issues. CSW asked RN to order psych consult for intentional overdose and for psych social worker to assess. Unit social worker will continue to follow.  Maree Krabbe, MSW, Theresia Majors [phone]

## 2013-10-07 DIAGNOSIS — C9110 Chronic lymphocytic leukemia of B-cell type not having achieved remission: Secondary | ICD-10-CM

## 2013-10-07 DIAGNOSIS — T50901A Poisoning by unspecified drugs, medicaments and biological substances, accidental (unintentional), initial encounter: Secondary | ICD-10-CM

## 2013-10-07 DIAGNOSIS — F3130 Bipolar disorder, current episode depressed, mild or moderate severity, unspecified: Secondary | ICD-10-CM

## 2013-10-07 LAB — BASIC METABOLIC PANEL
BUN: 9 mg/dL (ref 6–23)
GFR calc Af Amer: >90 mL/min (ref >90)
GFR calc non Af Amer: 84 mL/min — ABNORMAL LOW (ref >90)
Potassium: 3.9 mEq/L (ref 3.7–5.3)
Sodium: 142 mEq/L (ref 137–147)

## 2013-10-07 LAB — CBC
Hemoglobin: 11.6 g/dL — ABNORMAL LOW (ref 12.0–15.0)
MCHC: 33.2 g/dL (ref 30.0–36.0)
RBC: 3.71 MIL/uL — ABNORMAL LOW (ref 3.87–5.11)
WBC: 15 10*3/uL — ABNORMAL HIGH (ref 4.0–10.5)

## 2013-10-07 MED ORDER — NICOTINE 21 MG/24HR TD PT24
21.0000 mg | MEDICATED_PATCH | Freq: Every day | TRANSDERMAL | Status: DC
  Administered 2013-10-07 – 2013-10-08 (×2): 21 mg via TRANSDERMAL
  Filled 2013-10-07 (×2): qty 1

## 2013-10-07 MED ORDER — FOLIC ACID 1 MG PO TABS
2.0000 mg | ORAL_TABLET | Freq: Every morning | ORAL | Status: DC
  Administered 2013-10-08: 2 mg via ORAL
  Filled 2013-10-07: qty 2

## 2013-10-07 MED ORDER — ALPRAZOLAM 0.5 MG PO TABS
0.5000 mg | ORAL_TABLET | Freq: Every day | ORAL | Status: DC
  Administered 2013-10-07: 1 mg via ORAL
  Filled 2013-10-07: qty 2

## 2013-10-07 MED ORDER — CLINDAMYCIN HCL 300 MG PO CAPS
300.0000 mg | ORAL_CAPSULE | Freq: Four times a day (QID) | ORAL | Status: DC
  Administered 2013-10-07 – 2013-10-08 (×5): 300 mg via ORAL
  Filled 2013-10-07 (×7): qty 1

## 2013-10-07 MED ORDER — BENZONATATE 100 MG PO CAPS: 200.0000 mg | ORAL_CAPSULE | Freq: Four times a day (QID) | ORAL | Status: DC | PRN

## 2013-10-07 MED ORDER — BENZONATATE 100 MG PO CAPS
200.0000 mg | ORAL_CAPSULE | Freq: Three times a day (TID) | ORAL | Status: DC | PRN
  Filled 2013-10-07: qty 2

## 2013-10-07 NOTE — Clinical Social Work Psych Note (Addendum)
12:24pm- Psych CSW notified MD of documentation needed if pt is medically stable and ready for dc.  Upon which time, Psych CSW will seek placement at [facility].  [facility] requires such documention prior to review.  Psych CSW received consult.  Psychiatry recommends inpatient psychiatric treatment upon dc.  Please contact Psych CSW once pt is medically cleared for placement.  Psych CSW updated and received updates from unit CSW.  [facility] can be reached directly at [phone].    Vickii Penna, LCSWA [phone]  Clinical Social Work

## 2013-10-07 NOTE — Clinical Social Work Note (Signed)
Per psychiatry disposition, pt discharging to in-patient psych services. Deferred to psych CSW. Unit CSW signing off. Please re consult if needed.  Darlyn Chamber, LCSWA Clinical Social Worker [phone]

## 2013-10-07 NOTE — Progress Notes (Signed)
Have collaborated with pt and agree with note. 10/07/2013  Tioga Bing, PT [phone]  (pager)

## 2013-10-07 NOTE — Progress Notes (Signed)
Physical Therapy Treatment Patient Details Name: Theresa Castillo MRN: [id number] DOB: Sep 19, 1966 Today's Date: 10/07/2013 Time: 7829-5621 PT Time Calculation (min): 11 min  PT Assessment / Plan / Recommendation  History of Present Illness Pt presents via EMS after apparent drug overdose. Per EMS the patient sent out multiple emails about wanting to end her life. [employer] went to the scene to find the patient unresponsive with empty pill bottle. The patient was found with multiple pill bottles with some still filled and some empty. Notably the patient's hydrocodone was empty. EMS gave 2 mg of narcan without significant response. An NPA was placed and EMS assisted ventilations en route. History and ROS limited secondary to the mental status of the patient.    PT Comments   Patient ambulated Mod I throughout hallway. No stairs practiced as patient going to inpatient pshyc at [facility]. Recommend ambulating with staff during the day  Follow Up Recommendations  No PT follow up     Does the patient have the potential to tolerate intense rehabilitation     Barriers to Discharge        Equipment Recommendations       Recommendations for Other Services    Frequency     Progress towards PT Goals Progress towards PT goals: Goals met/education completed, patient discharged from PT  Plan      Precautions / Restrictions     Pertinent Vitals/Pain no apparent distress     Mobility  Bed Mobility Supine to Sit: 7: Independent Sitting - Scoot to Edge of Bed: 7: Independent Transfers Sit to Stand: 7: Independent Stand to Sit: 7: Independent Ambulation/Gait Ambulation/Gait Assistance: 6: Modified independent (Device/Increase time) Ambulation Distance (Feet): 600 Feet Gait Pattern: Within Functional Limits Stairs: No    Exercises     PT Diagnosis:    PT Problem List:   PT Treatment Interventions:     PT Goals (current goals can now be found in the care plan section)    Visit Information  Last PT Received On: 10/07/13 Assistance Needed: +1 History of Present Illness: Pt presents via EMS after apparent drug overdose. Per EMS the patient sent out multiple emails about wanting to end her life. [employer] went to the scene to find the patient unresponsive with empty pill bottle. The patient was found with multiple pill bottles with some still filled and some empty. Notably the patient's hydrocodone was empty. EMS gave 2 mg of narcan without significant response. An NPA was placed and EMS assisted ventilations en route. History and ROS limited secondary to the mental status of the patient.     Subjective Data      Cognition  Cognition Arousal/Alertness: Awake/alert Overall Cognitive Status: Within Functional Limits for tasks assessed    Balance     End of Session PT - End of Session Activity Tolerance: Patient tolerated treatment well Patient left: in chair;with nursing/sitter in room   GP     Robinette, Adline Potter 10/07/2013, 2:17 PM  .sign
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
## 2013-10-08 ENCOUNTER — Inpatient Hospital Stay (HOSPITAL_COMMUNITY): Payer: BC Managed Care – PPO

## 2013-10-08 ENCOUNTER — Encounter (HOSPITAL_COMMUNITY): Payer: Self-pay | Admitting: Behavioral Health

## 2013-10-08 ENCOUNTER — Inpatient Hospital Stay (HOSPITAL_COMMUNITY)
Admission: EM | Admit: 2013-10-08 | Discharge: 2013-10-13 | DRG: 885 | Disposition: A | Discharge status: Home / Self Care | Payer: BC Managed Care – PPO | Source: Intra-hospital | Attending: Psychiatry | Admitting: Psychiatry

## 2013-10-08 DIAGNOSIS — F411 Generalized anxiety disorder: Secondary | ICD-10-CM | POA: Diagnosis present

## 2013-10-08 DIAGNOSIS — Z79899 Other long term (current) drug therapy: Secondary | ICD-10-CM

## 2013-10-08 DIAGNOSIS — R45851 Suicidal ideations: Secondary | ICD-10-CM

## 2013-10-08 DIAGNOSIS — F332 Major depressive disorder, recurrent severe without psychotic features: Secondary | ICD-10-CM | POA: Diagnosis present

## 2013-10-08 DIAGNOSIS — F3130 Bipolar disorder, current episode depressed, mild or moderate severity, unspecified: Secondary | ICD-10-CM | POA: Diagnosis present

## 2013-10-08 HISTORY — DX: Sleep apnea, unspecified: G47.30

## 2013-10-08 MED ORDER — BUPROPION HCL ER (XL) 150 MG PO TB24: 150.0000 mg | ORAL_TABLET | Freq: Every day | ORAL | Status: DC

## 2013-10-08 MED ORDER — NICOTINE 21 MG/24HR TD PT24
21.0000 mg | MEDICATED_PATCH | Freq: Every day | TRANSDERMAL | Status: DC
  Administered 2013-10-09 – 2013-10-13 (×5): 21 mg via TRANSDERMAL
  Filled 2013-10-08 (×8): qty 1

## 2013-10-08 MED ORDER — LEVOTHYROXINE SODIUM 50 MCG PO TABS
50.0000 ug | ORAL_TABLET | Freq: Every day | ORAL | Status: DC
  Administered 2013-10-09 – 2013-10-13 (×5): 50 ug via ORAL
  Filled 2013-10-08 (×7): qty 1

## 2013-10-08 MED ORDER — PHENOL 1.4 % MT LIQD
1.0000 | OROMUCOSAL | Status: DC | PRN
  Administered 2013-10-09 (×3): 1 via OROMUCOSAL
  Filled 2013-10-08 (×2): qty 177

## 2013-10-08 MED ORDER — VENLAFAXINE HCL ER 150 MG PO CP24
150.0000 mg | ORAL_CAPSULE | Freq: Every day | ORAL | Status: DC
  Administered 2013-10-09: 150 mg via ORAL
  Filled 2013-10-08 (×4): qty 1

## 2013-10-08 MED ORDER — FOLIC ACID 1 MG PO TABS
2.0000 mg | ORAL_TABLET | Freq: Every morning | ORAL | Status: DC
  Administered 2013-10-09 – 2013-10-13 (×5): 2 mg via ORAL
  Filled 2013-10-08 (×8): qty 2

## 2013-10-08 MED ORDER — NICOTINE 21 MG/24HR TD PT24: 21.0000 mg | MEDICATED_PATCH | Freq: Every day | TRANSDERMAL | Status: DC

## 2013-10-08 MED ORDER — BUPROPION HCL ER (XL) 150 MG PO TB24
150.0000 mg | ORAL_TABLET | Freq: Every day | ORAL | Status: DC
  Administered 2013-10-09 – 2013-10-13 (×5): 150 mg via ORAL
  Filled 2013-10-08: qty 1
  Filled 2013-10-08: qty 3
  Filled 2013-10-08 (×3): qty 1
  Filled 2013-10-08: qty 3
  Filled 2013-10-08 (×3): qty 1

## 2013-10-08 MED ORDER — ACETAMINOPHEN 325 MG PO TABS
650.0000 mg | ORAL_TABLET | Freq: Four times a day (QID) | ORAL | Status: DC | PRN
Start: 2013-10-08 — End: 2013-10-11
  Administered 2013-10-09 – 2013-10-11 (×4): 650 mg via ORAL
  Filled 2013-10-08 (×4): qty 2

## 2013-10-08 MED ORDER — OLANZAPINE 5 MG PO TABS
5.0000 mg | ORAL_TABLET | Freq: Every day | ORAL | Status: DC
  Administered 2013-10-08 – 2013-10-12 (×5): 5 mg via ORAL
  Filled 2013-10-08 (×5): qty 1
  Filled 2013-10-08 (×2): qty 2
  Filled 2013-10-08: qty 1
  Filled 2013-10-08: qty 3

## 2013-10-08 MED ORDER — ALUM & MAG HYDROXIDE-SIMETH 200-200-20 MG/5ML PO SUSP: 30.0000 mL | ORAL | Status: DC | PRN

## 2013-10-08 MED ORDER — MAGNESIUM HYDROXIDE 400 MG/5ML PO SUSP: 30.0000 mL | Freq: Every day | ORAL | Status: DC | PRN

## 2013-10-08 NOTE — Progress Notes (Addendum)
CSW received call from attending MD, that patient may be medically stable for discharge today for inpatient psychiatric treatment. CSW spoke with Lyda Jester who will review patient for possible admission. Per attending she is rounding on patients now and will have updated note in shortly regarding patient medical status.   Dorathy Kinsman, LCSW on call csw pager # [phone] .10/08/2013 1216pm   Per discussion with Lyda Jester at [facility], concerns were made about patient elevated wbc. Per dsicussion with MD that is Chronic related to COL and will include this in patient discharge summary. Per discussion with MD patient is walking fully independently and does not need any assistance. Per MD, she will have nurses walk patient in the hallway to ensure patient can walk fully independently. CSW spoke with MD regarding [facility] stating that patient would need to walk a good distance from unit to the cafeteria and could potentially need to walk stairs. CSW to follow up with Lyda Jester once MD note is completed addressing WBC and once nurses walk patient regarding patient ability to maneuver around [place].   Dorathy Kinsman, LCSW [phone]  ED CSW .10/08/2013 1247pm

## 2013-10-08 NOTE — Progress Notes (Addendum)
CSW spoke with Lyda Jester at [facility] who will review patient and speak with nurse. CSW awaiting to hear when patient has bed available at [facility]. Per ac, a bed is anticipated for possibly later today.   Dorathy Kinsman, LCSW on call csw pager # [phone] .10/08/2013 1443pm

## 2013-10-08 NOTE — Progress Notes (Signed)
Report called to Marc Morgans, Therapist, sports at [facility]. Charlies Silvers, RN

## 2013-10-08 NOTE — Discharge Summary (Signed)
Physician Discharge Summary  Theresa Castillo [id number] DOB: [birth year] DOA: 10/04/2013  PCP: Theresa Fraction, MD  Admit date: 10/04/2013 Discharge date: 10/08/2013  Time spent: greater than 30 min  Discharge Diagnoses:  Active Problems:   Drug overdose with resulting vent dependent respiratory failure. 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 She reportedly wrote a suicide note to family members The uds positive for amphetamines, benzodiazepines, opiates. After patient extubated, The patient admitted it was a suicidal attempt. She took Zyprexa, Effexor and ibuprofen after having an argument with her father. Patient told she was feeling very depressed, hopeless and helpless but her father mentioned that she is a failure in her life. Patient see Theresa Castillo for her psychiatric illness and medication management. She's been seen for her for 6 years. Patient has one psychiatric hospitalization at [facility] 4 years ago. Patient admitted feeling very tired, depressed, feelings of hopelessness and continued to endorse suicidal thoughts. Patient lives alone. Patient denies any paranoia auditory hallucination but admitted to history of bipolar disorder. Patient is guarded and did not provide much information. She admitted history of smoking marijuana, alcohol and using cocaine. 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  She had been started on antibiotic do to leukocytosis. After she was able to give history she reports  that her white blood cell count is chronically elevated, and that she has an appointment with hematology in the near future. initially, evidence of right lung collapse, but this has resolved with pulmonary toilet, and patient has no change in chronic cough, or fevers. Repeat chest x-ray just shows atelectasis. Antibiotics have been stopped. She has been started back on some of her home medications, but other medications will need to be addressed by psychiatry. She has remained with a one-to-one sitter, and is currently awaiting bed with inpatient psychiatry. Her vital signs remain stable. And she is ambulatory without difficulty. She has been pleasant and cooperative.  Procedures:  Intubation mechanical ventilation  Consultations:  Psychiatry  pulmonary critical care  Discharge Exam: Filed Vitals:   10/08/13 0553  BP: 120/72  Pulse: 74  Temp: 97.5 F (36.4 C)  Resp: 20    General: smiling, but occasionally tearful. Cooperative. Alert, oriented. In no respiratory distress. No coughing spells. Cardiovascular: regular rate rhythm without murmurs gallops rubs Respiratory:  clear to auscultation bilaterally without wheezes rhonchi or rales Soft nontender nondistended abdomen Extremities: No clubbing cyanosis or edema  Discharge Instructions  Discharge Orders   Future Orders Complete By Expires   Activity as tolerated - No restrictions  As directed    Diet general  As directed        Medication List    STOP taking these medications       amphetamine-dextroamphetamine 10 MG tablet  Commonly known as:  ADDERALL     amphetamine-dextroamphetamine 20 MG tablet  Commonly known as:  ADDERALL     CRANBERRY PO     doxepin 10 MG capsule  Commonly known as:  SINEQUAN     FISH OIL PO  ibuprofen 800 MG tablet  Commonly known as:  ADVIL,MOTRIN     loratadine 10 MG tablet  Commonly known as:  CLARITIN     meloxicam 15 MG tablet  Commonly known as:  MOBIC     naproxen  sodium 550 MG tablet  Commonly known as:  ANAPROX      TAKE these medications       ALPRAZolam 1 MG tablet  Commonly known as:  XANAX  Take 0.5-1 mg by mouth at bedtime.     benzonatate 200 MG capsule  Commonly known as:  TESSALON  Take 200 mg by mouth 3 (three) times daily.     buPROPion 150 MG 24 hr tablet  Commonly known as:  WELLBUTRIN XL  Take 150 mg by mouth daily.     folic acid 1 MG tablet  Commonly known as:  FOLVITE  Take 2 mg by mouth every morning.     HYDROcodone-acetaminophen 5-325 MG per tablet  Commonly known as:  NORCO/VICODIN  Take 1 tablet by mouth every 6 (six) hours as needed for moderate pain.     levothyroxine 50 MCG tablet  Commonly known as:  SYNTHROID, LEVOTHROID  Take 50 mcg by mouth every morning.     nicotine 21 mg/24hr patch  Commonly known as:  NICODERM CQ - dosed in mg/24 hours  Place 1 patch (21 mg total) onto the skin daily.     OLANZapine 5 MG tablet  Commonly known as:  ZYPREXA  Take 5 mg by mouth at bedtime.     venlafaxine XR 150 MG 24 hr capsule  Commonly known as:  EFFEXOR-XR  Take 150 mg by mouth daily with breakfast.      Or per psychiatrist             Allergies  Allergen Reactions  . Ammonia Other (See Comments)    Severe migraines  . Amoxicillin Other (See Comments)    syncope  . Topamax Other (See Comments)    Caused neurological deficits, confusion, bad intentions   . Pineapple Nausea Only  . Latex Rash and Other (See Comments)    Cracking of skin  . Trazodone And Nefazodone Other (See Comments)    unknown      The results of significant diagnostics from this hospitalization (including imaging, microbiology, ancillary and laboratory) are listed below for reference.    Significant Diagnostic Studies: Dg Chest 2 View  10/08/2013   CLINICAL DATA:  Pneumonia.  EXAM: CHEST  2 VIEW  COMPARISON:  10/06/2013  FINDINGS: Lungs are adequately inflated and demonstrate patchy bibasilar opacification which is  mostly of linear morphology likely atelectasis, although cannot exclude infection. No definite effusion. Cardiomediastinal silhouette and remainder of the exam is unchanged.  IMPRESSION: Bibasilar opacification predominately linear suggesting atelectasis, although cannot exclude infection.   Electronically Signed   By: Marin Olp M.D.   On: 10/08/2013 08:36   Ct Head Wo Contrast  10/04/2013   CLINICAL DATA:  Drug overdose.  Patient is not speaking.  EXAM: CT HEAD WITHOUT CONTRAST  TECHNIQUE: Contiguous axial images were obtained from the base of the skull through the vertex without intravenous contrast.  COMPARISON:  11/02/2008  FINDINGS: Patient is rotated, limiting examination. Endotracheal and OG tubes are in place. Ventricles and sulci appear symmetrical. No mass effect or midline shift. No abnormal extra-axial fluid collections. Gray-white matter junctions are distinct. Basal cisterns are not effaced. No evidence of acute intracranial hemorrhage. No depressed skull fractures. Visualized paranasal sinuses and  mastoid air cells are not opacified. No significant change since previous study.  IMPRESSION: No acute intracranial abnormalities demonstrated.   Electronically Signed   By: Lucienne Capers M.D.   On: 10/04/2013 23:01   Dg Chest Port 1 View  10/06/2013   CLINICAL DATA:  Aspiration pneumonia  EXAM: PORTABLE CHEST - 1 VIEW  COMPARISON:  10/04/2013  FINDINGS: Collapse/ consolidation in the right base is assistant with elevation of the right hemidiaphragm. There is patchy atelectasis or infiltrate in the left base. Cardiopericardial silhouette is upper normal in size to mildly enlarged. Endotracheal tube has been removed in the interval. The volume loss in the right lung retracts the trachea to the right. Telemetry leads overlie the chest.  IMPRESSION: Collapse/ consolidation in the right lung base with patchy atelectasis or infiltrate in the left lower lobe.   Electronically Signed   By: Misty Stanley M.D.   On: 10/06/2013 08:23   Dg Chest Portable 1 View  10/04/2013   CLINICAL DATA:  Drug overdose.  Intubation.  EXAM: PORTABLE CHEST - 1 VIEW  COMPARISON:  One view chest x-ray 06/26/2013.  FINDINGS: Endotracheal tube tip in satisfactory position projecting approximately 3 cm above the carina. Linear atelectasis in the lung bases bilaterally. Patchy airspace opacity suspected in the right upper lobe. Lungs otherwise clear. No visible pleural effusions. Cardiomediastinal silhouette unremarkable and unchanged.  IMPRESSION: 1. Endotracheal tube tip in satisfactory position projecting approximately 3 cm above the carina. 2. Atelectasis in both lung bases. 3. Possible developing pneumonia in the right upper lobe, query aspiration.   Electronically Signed   By: Evangeline Dakin M.D.   On: 10/04/2013 22:17    Microbiology: Recent Results (from the past 240 hour(s))  URINE CULTURE     Status: None   Collection Time    10/04/13 10:15 PM      Result Value Range Status   Specimen Description URINE, CATHETERIZED   Final   Special Requests NONE   Final   Culture  Setup Time     Final   Value: 10/05/2013 09:15     Performed at [facility]     Final   Value: NO GROWTH     Performed at [facility]   Culture     Final   Value: NO GROWTH     Performed at [facility]   Report Status 10/06/2013 FINAL   Final  MRSA PCR SCREENING     Status: None   Collection Time    10/04/13 10:58 PM      Result Value Range Status   MRSA by PCR NEGATIVE  NEGATIVE Final   Comment:            The GeneXpert MRSA Assay (FDA     approved for NASAL specimens     only), is one component of a     comprehensive MRSA colonization     surveillance program. It is not     intended to diagnose MRSA     infection nor to guide or     monitor treatment for     MRSA infections.     Labs: Basic Metabolic Panel:  Recent Labs Lab 10/04/13 2205 10/04/13 2214 10/05/13 0125  10/05/13 0355 10/06/13 0354 10/07/13 0653  NA 133* 138 137 137 139 142  K 4.0 4.1 3.7 3.5 4.1 3.9  CL 98 101 103 102 101 104  CO2 24  --  23 25 25  26  GLUCOSE 105* 107* 96 87 88 98  BUN 13 13 13 12 10 9   CREATININE 0.75 0.90 0.73 0.68 0.74 0.81  CALCIUM 8.5  --  8.7 8.8 8.7 8.6  MG  --   --   --  2.0  --   --   PHOS  --   --   --  4.0  --   --    Liver Function Tests:  Recent Labs Lab 10/04/13 2205 10/05/13 0125  AST 16 17  ALT 12 12  ALKPHOS 102 96  BILITOT 0.2* 0.4  PROT 7.0 6.8  ALBUMIN 3.3* 3.2*   No results found for this basename: LIPASE, AMYLASE,  in the last 168 hours  Recent Labs Lab 10/04/13 2310  AMMONIA 22   CBC:  Recent Labs Lab 10/04/13 2205 10/04/13 2214 10/05/13 0355 10/06/13 0354 10/07/13 0653  WBC 15.4*  --  15.3* 17.2* 15.0*  NEUTROABS 11.0*  --   --   --   --   HGB 13.3 12.9 13.3 11.5* 11.6*  HCT 40.0 38.0 39.8 34.3* 34.9*  MCV 95.5  --  94.3 94.0 94.1  PLT 270  --  269 274 255   Cardiac Enzymes:  Recent Labs Lab 10/05/13 0125 10/05/13 0355  TROPONINI <0.30 <0.30   BNP: BNP (last 3 results) No results found for this basename: PROBNP,  in the last 8760 hours CBG:  Recent Labs Lab 10/04/13 2124 10/05/13 0726 10/05/13 1154  GLUCAP 108* 84 88       Signed:  [place] L  [facility] 10/08/2013, 12:47 PM

## 2013-10-08 NOTE — Tx Team (Signed)
Initial Interdisciplinary Treatment Plan  PATIENT STRENGTHS: (choose at least two) Ability for insight Active sense of humor Capable of independent living Supportive family/friends  PATIENT STRESSORS: Educational concerns Financial difficulties Legal issue Marital or family conflict   PROBLEM LIST: Problem List/Patient Goals Date to be addressed Date deferred Reason deferred Estimated date of resolution  depression      Health issues      Family conflict                                           DISCHARGE CRITERIA:  Ability to meet basic life and health needs Adequate post-discharge living arrangements Improved stabilization in mood, thinking, and/or behavior Safe-care adequate arrangements made  PRELIMINARY DISCHARGE PLAN: Attend aftercare/continuing care group Outpatient therapy Return to previous living arrangement Return to previous work or school arrangements  PATIENT/FAMIILY INVOLVEMENT: This treatment plan has been presented to and reviewed with the patient, Theresa Castillo.  The patient and family have been given the opportunity to ask questions and make suggestions.  Pricilla Riffle M 10/08/2013, 9:14 PM

## 2013-10-08 NOTE — Progress Notes (Signed)
Patient is medically stable for discharge to  [facility].  Doree Barthel, M.D. [facility] [phone]

## 2013-10-08 NOTE — Progress Notes (Signed)
Pt accepted to [facility] 506-2 by Dr. Adele Schilder. RN can call report to [phone]. CSW spoke with patient nurse, who states patient is voluntary. CSW will have voluntary admission form faxed to nurse Arbie Cookey, (fax: [phone]) who agreed to have patient sign. RN will fax the voluntary form to assessment at [phone]. Pt will be transported voluntarily by [employer]. RN can call [facility] at [phone] to arrange transportation.    Arbie Cookey # [phone]   .Dorathy Kinsman, LCSW on call csw pager # [phone] .10/08/2013 1728
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
## 2013-10-08 NOTE — Treatment Plan (Signed)
Pt accepted by Dr. Adele Schilder to [facility], room [phone].  Bed is ready; RN on med floor can call report to the adult unit at [phone].

## 2013-10-09 ENCOUNTER — Encounter (HOSPITAL_COMMUNITY): Payer: Self-pay | Admitting: Behavioral Health

## 2013-10-09 DIAGNOSIS — T50901A Poisoning by unspecified drugs, medicaments and biological substances, accidental (unintentional), initial encounter: Secondary | ICD-10-CM

## 2013-10-09 NOTE — BHH Group Notes (Signed)
[facility] LCSW Group Therapy  10/09/2013 3:34 PM  Type of Therapy:  Group Therapy  Participation Level:  Minimal  Participation Quality:  Redirectable  Affect:  Appropriate  Cognitive:  Appropriate  Insight:  Developing/Improving and Improving  Engagement in Therapy:  Developing/Improving  Modes of Intervention:  Discussion, Education, Exploration, Problem-solving, Rapport Building and Support  Summary of Progress/Problems:  Patient had to be redirected due to wanting to make complaints about MD.  She did not engage in group discussion.  Concha Pyo 10/09/2013, 3:34 PM
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
## 2013-10-09 NOTE — BHH Group Notes (Signed)
[facility] LCSW Aftercare Discharge Planning Group Note   10/09/2013 10:47 AM    Participation Quality:  Appropraite  Mood/Affect:  Appropriate  Depression Rating:  2  Anxiety Rating:  1  Thoughts of Suicide:  No  Will you contract for safety?   NA  Current AVH:  No  Plan for Discharge/Comments:  Patient attended discharge planning group and actively participated in group.  She advised of outpatient follow up with [facility].  CSW provided all participants with daily workbook.   Transportation Means: Patient has transportation.   Supports:  Patient has a support system.   Bernetta Sutley, Eulas Post

## 2013-10-09 NOTE — BHH Suicide Risk Assessment (Signed)
Suicide Risk Assessment  Admission Assessment     Nursing information obtained from:  Patient Demographic factors:    Current Mental Status:    Loss Factors:    Historical Factors:    Risk Reduction Factors:     CLINICAL FACTORS:   Severe Anxiety and/or Agitation Bipolar Disorder:   Mixed State Depression:   Anhedonia Hopelessness Impulsivity Insomnia Recent sense of peace/wellbeing Severe More than one psychiatric diagnosis Unstable or Poor Therapeutic Relationship Previous Psychiatric Diagnoses and Treatments Medical Diagnoses and Treatments/Surgeries  COGNITIVE FEATURES THAT CONTRIBUTE TO RISK:  Closed-mindedness Loss of executive function Polarized thinking    SUICIDE RISK:  Severe:  Frequent, intense, and enduring suicidal ideation, specific plan, no subjective intent, but some objective markers of intent (i.e., choice of lethal method), the method is accessible, some limited preparatory behavior, evidence of impaired self-control, severe dysphoria/symptomatology, multiple risk factors present, and few if any protective factors, particularly a lack of social support.  PLAN OF CARE: Admitted for crisis stabilization, safety monitoring and medication management. Patient was presented with the overdose of several psychotropic medications as a suicidal attempt after having augment with her dad.  I certify that inpatient services furnished can reasonably be expected to improve the patient's condition.  Delesa Kawa,JANARDHAHA R. 10/09/2013, 12:35 PM

## 2013-10-09 NOTE — Progress Notes (Signed)
D: Patient's affect appropriate to circumstance and mood is anxious. She reported on the self inventory sheet that sleep and ability to pay attention are both poor, appetite is good and energy level is normal. Patient rated depression "2" and feelings of hopelessness "1". She's attending groups and interacting with peers in the milieu. Patient compliant with medication regimen.  A: Support and encouragement provided to patient. Scheduled medications administered per MD orders. Maintain Q15 minute checks.  R: Patient receptive. Denies SI/HI/AVH. Patient remains safe.
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
## 2013-10-10 DIAGNOSIS — T50901S Poisoning by unspecified drugs, medicaments and biological substances, accidental (unintentional), sequela: Secondary | ICD-10-CM

## 2013-10-10 DIAGNOSIS — T6591XS Toxic effect of unspecified substance, accidental (unintentional), sequela: Secondary | ICD-10-CM

## 2013-10-10 DIAGNOSIS — F3130 Bipolar disorder, current episode depressed, mild or moderate severity, unspecified: Principal | ICD-10-CM

## 2013-10-10 MED ORDER — PANTOPRAZOLE SODIUM 20 MG PO TBEC
20.0000 mg | DELAYED_RELEASE_TABLET | Freq: Every day | ORAL | Status: DC
  Administered 2013-10-11 – 2013-10-13 (×3): 20 mg via ORAL
  Filled 2013-10-10 (×5): qty 1

## 2013-10-10 MED ORDER — ASPIRIN EC 81 MG PO TBEC
81.0000 mg | DELAYED_RELEASE_TABLET | Freq: Every day | ORAL | Status: DC
  Administered 2013-10-11 – 2013-10-13 (×3): 81 mg via ORAL
  Filled 2013-10-10 (×5): qty 1

## 2013-10-10 MED ORDER — AMPHETAMINE-DEXTROAMPHETAMINE 10 MG PO TABS
20.0000 mg | ORAL_TABLET | Freq: Every day | ORAL | Status: DC
  Administered 2013-10-11 – 2013-10-13 (×3): 20 mg via ORAL
  Filled 2013-10-10 (×3): qty 2

## 2013-10-10 MED ORDER — VENLAFAXINE HCL ER 150 MG PO CP24
150.0000 mg | ORAL_CAPSULE | Freq: Every day | ORAL | Status: DC
  Administered 2013-10-10 – 2013-10-13 (×4): 150 mg via ORAL

## 2013-10-10 NOTE — Progress Notes (Signed)
Psychoeducational Group Note  Date: 10/10/2013 Time:  1015  Group Topic/Focus:  Identifying Needs:   The focus of this group is to help patients identify their personal needs that have been historically problematic and identify healthy behaviors to address their needs.  Participation Level:  Active  Participation Quality:  Appropriate  Affect:  Appropriate  Cognitive:  Oriented  Insight:  Engaged  Engagement in Group:  Engaged  Additional Comments:  Pt participated and is engaged in the group  Josha Weekley A 

## 2013-10-10 NOTE — Progress Notes (Signed)
Adult Psychoeducational Group Note  Date:  10/10/2013 Time:  11:01 PM  Group Topic/Focus:  Wrap-Up Group:   The focus of this group is to help patients review their daily goal of treatment and discuss progress on daily workbooks.  Participation Level:  Active  Participation Quality:  Attentive  Affect:  Appropriate  Cognitive:  Alert  Insight: Appropriate  Engagement in Group:  Engaged  Modes of Intervention:  Discussion  Additional Comments: Patient says the day was good until her arm swelled up. 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  Donney Rankins 10/10/2013, 11:01 PM

## 2013-10-10 NOTE — Progress Notes (Signed)
.  Psychoeducational Group Note    Date: 10/10/2013 Time:  0930    Goal Setting Purpose of Group: To be able to set a goal that is measurable and that can be accomplished in one day Participation Level:  Active  Participation Quality:  Appropriate  Affect:  Appropriate  Cognitive:  Oriented  Insight:  Improving  Engagement in Group:  Engaged  Additional Comments:  Engaged and participated in the group  [place], [place] A

## 2013-10-10 NOTE — Progress Notes (Signed)
D) Pt attending the groups, participates in group and interacting with her peers. Denies SI and HI presently. Rates her depression at a 2 and her hopelessness at a 1. Concerned that she has not been prescribed some of her medications that she takes at home and this was related to the provider.  States that she is not feeling as bad as she was when she first came and is more willing to work on her issues. A) Given support, reassurance and praise.

## 2013-10-10 NOTE — Progress Notes (Signed)
[facility] Group Notes:  (Nursing/MHT/Case Management/Adjunct)  Date:  10/10/2013  Time:  12:22 AM  Type of Therapy:  Group Therapy  Participation Level:  Minimal  Participation Quality:  Appropriate  Affect:  Appropriate  Cognitive:  Appropriate  Insight:  Appropriate  Engagement in Group:  Developing/Improving  Modes of Intervention:  Socialization and Support  Summary of Progress/Problems: Pt. Participated in group.  Pt. Stated she may get a pet to help with relapse prevention.  Lanell Persons 10/10/2013, 12:22 AM

## 2013-10-10 NOTE — Progress Notes (Signed)
Writer observed patient up in the dayroom interacting with peers appropriately. Patient attended group this evening and participated. Writer spoke with patient 1:1 and she reports that her mother-in-law, who is supportive will be returning to [place] this weekend. She came to visit her on today and will be returning on tomorrow. Patient reports that the news she received from her doctor was overwhelming and that caused her to overdose. Writer encouraged patient to attend groups and learn coping skills that she may be able to use once discharged since her mother-in-law will be going back home. Patient was receptive to advice. She has been compliant with scheduled meds and currently denies si/hi/a/v hallucinations. Safety maintained on unit with 15 min checks.

## 2013-10-10 NOTE — Progress Notes (Signed)
[facility] MD Progress Note  10/10/2013 3:21 PM Theresa Castillo  MRN:  [id number] Subjective:  Patient stated she slept well last night without her Xanax she takes at home and did not want it ordered here.  Her Adderall 20 mg daily from home for ADHD was ordered and her baby aspirin along with Protonix.  Appetite is good, depression better today, energetic with "lot more energy."  Her mother visited last night and it went well, hopeful and positive for the future. 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 Allergy     pineapple abd pain and diarrhea  . Anxiety   . Depression   . Thyroid disease     borderline hypo  . Ulcer     born with stomach ulcer  . Hyperlipidemia   . 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 Chronic pain of right wrist   . Abnormal Pap smear   . Sleep apnea    Axis IV: other psychosocial or environmental problems, problems related to social environment and problems with primary support group Axis V: 41-50 serious symptoms  ADL's:  Intact  Sleep: Good  Appetite:  Good  Suicidal Ideation:  Plan:  vague  Intent:  none Means:  none Homicidal Ideation:  Denies   Psychiatric Specialty Exam: Review of Systems  Constitutional: Negative.   HENT: Negative.   Eyes: Negative.   Respiratory: Negative.   Cardiovascular: Negative.   Gastrointestinal: Negative.   Genitourinary: Negative.   Musculoskeletal: Negative.   Skin: Negative.   Neurological: Negative.   Endo/Heme/Allergies: Negative.   Psychiatric/Behavioral: Positive for depression. The patient is nervous/anxious.     Blood pressure 133/91, pulse 82, temperature 97.4 F (36.3 C), temperature source Oral, resp. rate 18, height 5' 4.25" (1.632 m), weight 109.77 kg (242 lb), SpO2 98.00%.Body mass index is 41.21 kg/(m^2).  General Appearance: Casual  Eye Contact::  Fair  Speech:  Normal Rate  Volume:   Normal  Mood:  Anxious  Affect:  Congruent  Thought Process:  Coherent  Orientation:  Full (Time, Place, and Person)  Thought Content:  WDL  Suicidal Thoughts:  Yes.  without intent/plan  Homicidal Thoughts:  No  Memory:  Immediate;   Fair Recent;   Fair Remote;   Fair  Judgement:  Fair  Insight:  Fair  Psychomotor Activity:  EPS  Concentration:  Fair  Recall:  Fair  Akathisia:  No  Handed:  Right  AIMS (if indicated):     Assets:  Leisure Time Physical Health Resilience Social Support  Sleep:  Number of Hours: 5.75   Current Medications: Current Facility-Administered Medications  Medication Dose Route Frequency Provider Last Rate Last Dose  . acetaminophen (TYLENOL) tablet 650 mg  650 mg Oral Q6H PRN Lurena Nida, NP   650 mg at 10/09/13 2251  . alum & mag hydroxide-simeth (MAALOX/MYLANTA) 200-200-20 MG/5ML suspension 30 mL  30 mL Oral Q4H PRN Lurena Nida, NP      . Derrill Memo ON 10/11/2013] amphetamine-dextroamphetamine (ADDERALL) tablet 20 mg  20 mg Oral Q breakfast Waylan Boga, NP      . Derrill Memo ON 10/11/2013] aspirin EC tablet 81 mg  81 mg Oral Daily Waylan Boga, NP      . buPROPion (WELLBUTRIN XL) 24 hr tablet 150 mg  150 mg Oral Daily Lurena Nida, NP   150 mg at  10/10/13 5681  . folic acid (FOLVITE) tablet 2 mg  2 mg Oral q morning - 10a Lurena Nida, NP   2 mg at 10/10/13 1028  . levothyroxine (SYNTHROID, LEVOTHROID) tablet 50 mcg  50 mcg Oral Daily Lurena Nida, NP   50 mcg at 10/10/13 2751  . magnesium hydroxide (MILK OF MAGNESIA) suspension 30 mL  30 mL Oral Daily PRN Lurena Nida, NP      . nicotine (NICODERM CQ - dosed in mg/24 hours) patch 21 mg  21 mg Transdermal Daily Lurena Nida, NP   21 mg at 10/10/13 0824  . OLANZapine (ZYPREXA) tablet 5 mg  5 mg Oral QHS Lurena Nida, NP   5 mg at 10/09/13 2111  . [START ON 10/11/2013] pantoprazole (PROTONIX) EC tablet 20 mg  20 mg Oral Daily Waylan Boga, NP      . phenol (CHLORASEPTIC) mouth spray 1 spray  1 spray  Mouth/Throat PRN Lurena Nida, NP   1 spray at 10/09/13 2208  . venlafaxine XR (EFFEXOR-XR) 24 hr capsule 150 mg  150 mg Oral Q breakfast Durward Parcel, MD   150 mg at 10/10/13 1030    Lab Results: No results found for this or any previous visit (from the past 48 hour(s)).  Physical Findings: AIMS: Facial and Oral Movements Muscles of Facial Expression: None, normal Lips and Perioral Area: None, normal Jaw: None, normal Tongue: None, normal,Extremity Movements Upper (arms, wrists, hands, fingers): None, normal Lower (legs, knees, ankles, toes): None, normal, Trunk Movements Neck, shoulders, hips: None, normal, Overall Severity Severity of abnormal movements (highest score from questions above): None, normal Incapacitation due to abnormal movements: None, normal Patient's awareness of abnormal movements (rate only patient's report): No Awareness, Dental Status Current problems with teeth and/or dentures?: No Does patient usually wear dentures?: No  CIWA:    COWS:     Treatment Plan Summary: Daily contact with patient to assess and evaluate symptoms and progress in treatment Medication management  Plan:  Review of chart, vital signs, medications, and notes. 1-Individual and group therapy 2-Medication management for depression and anxiety:  Medications reviewed with the patient and aspirin 81 mg daily, Protonix 20 mg daily, and Adderall 20 mg daily for ADHD. 3-Coping skills for depression, anxiety 4-Continue crisis stabilization and management 5-Address health issues--monitoring vital signs, stable 6-Treatment plan in progress to prevent relapse of depression and anxiety  Medical Decision Making Problem Points:  Established problem, stable/improving (1) and Review of psycho-social stressors (1) Data Points:  Review of new medications or change in dosage (2)  I certify that inpatient services furnished can reasonably be expected to improve the patient's condition.    Waylan Boga, [place] 10/10/2013, 3:21 PM Agree with assessment and plan Geralyn Flash A. [place], [place].D

## 2013-10-10 NOTE — Progress Notes (Signed)
Writer has observed patient up in the dayroom talking with peers. Patient attended group this evening and participated. Patient requested her meds early and reported that she wanted to lie down. Patient reported that her son and step-mother visited on today and brightens as she talked about the visit. Patient c/o her right arm aching and when asked if she mentioned this to the NP/doctor today and she reported that she had heard that the more you complain, the longer you have to stay. Writer encouraged patient that if she feels that medications are not working  or issues arise while here its best to speak up while here. Patient was receptive and appreciative of the advice. Patient denies si/hi/a/v hallucinations.

## 2013-10-10 NOTE — Clinical Social Work Note (Signed)
Clinical Social Work Note  Patient asked to meet with CSW individually and reported she needs help with the following:  1. Has an appointment with her counselor Beverely Low at [facility] on Monday 1/5 (thinks it is at 3pm) that will need to be cancelled.  2. 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  Needs intervention to reschedule.  3. Is scheduled for surgery on Wednesday 1/7 at 12:15pm on her right eye and MUST BE [place]! 4.  Selmer Dominion, LCSW 10/10/2013, 5:42 PM

## 2013-10-10 NOTE — BHH Group Notes (Signed)
[place] Group Notes:  (Clinical Social Work)  10/10/2013   3:00-4:00PM  Summary of Progress/Problems:   The main focus of today's process group was for the patient to identify ways in which they have sabotaged their own mental health wellness/recovery.  Motivational interviewing was used to explore the reasons they engage in this behavior, and reasons they may have for wanting to change.  The Stages of Change were explained to the group using a handout, and patients identified where they are with regard to changing self-defeating behaviors.  The patient expressed that she devalues herself and procrastinates which leads to more negative self-talk.  She was tearful when discussing.  Type of Therapy:  Process Group  Participation Level:  Active  Participation Quality:  Attentive and Sharing  Affect:  Anxious and Depressed  Cognitive:  Appropriate and Oriented  Insight:  Engaged  Engagement in Therapy:  Engaged  Modes of Intervention:  Education, Motivational Interviewing   Selmer Dominion, LCSW 10/10/2013, 4:00pm

## 2013-10-11 DIAGNOSIS — F332 Major depressive disorder, recurrent severe without psychotic features: Secondary | ICD-10-CM | POA: Diagnosis present

## 2013-10-11 MED ORDER — IBUPROFEN 600 MG PO TABS
600.0000 mg | ORAL_TABLET | Freq: Four times a day (QID) | ORAL | Status: DC | PRN
  Administered 2013-10-11 – 2013-10-13 (×4): 600 mg via ORAL
  Filled 2013-10-11 (×4): qty 1

## 2013-10-11 MED ORDER — HYDROXYZINE HCL 50 MG PO TABS
50.0000 mg | ORAL_TABLET | Freq: Every evening | ORAL | Status: DC | PRN
  Administered 2013-10-11 – 2013-10-12 (×2): 50 mg via ORAL
  Filled 2013-10-11 (×2): qty 1
  Filled 2013-10-11: qty 3

## 2013-10-11 NOTE — Progress Notes (Signed)
Psychoeducational Group Note  Date: 10/11/2013 Time:  0930 Group Topic/Focus:  Gratefulness:  The focus of this group is to help patients identify what two things they are most grateful for in their lives. What helps ground them and to center them on their work to their recovery.  Participation Level:  Active  Participation Quality:  Appropriate  Affect:  Appropriate  Cognitive:  Oriented  Insight:  Improving  Engagement in Group:  Engaged  Additional Comments:  Pt. Was active in paying attention and participating.  Pattye Meda A  

## 2013-10-11 NOTE — Progress Notes (Signed)
Patient has been up and active on the unit, attended group this evening and participated. Patient has complained of right arm swelling and requested motrin which she received.  Patient currently denies si/hi/a/v hall. Support and encouragement offered, safety maintained on unit, will continue to monitor. Patient requested medication to aid with sleep and order received for visteril. Will monitor effectiveness of medication. Safety maintained on unit, will continue to monitor.

## 2013-10-11 NOTE — BHH Group Notes (Signed)
[place] Group Notes:  (Clinical Social Work)  10/11/2013  1:30-2:30pm  Summary of Progress/Problems:   The main focus of today's process group was to   identify the patient's current support system and decide on other supports that can be put in place.  The picture on workbook was used to discuss why additional supports are needed.  An emphasis was placed on using counselor, doctor, therapy groups, 12-step groups, and problem-specific support groups to expand supports.   There was also an extensive discussion about what constitutes a healthy support versus an unhealthy support.  The patient expressed full comprehension of the concepts presented, and agreed that there is a need to add more supports.  The patient stated the current support she has is only herself.  She was very involved throughout group and is willing to seek additional supports.   Type of Therapy:  Process Group with Motivational Interviewing  Participation Level:  Active  Participation Quality:  Attentive, Sharing and Supportive  Affect:  Blunted and Depressed  Cognitive:  Alert, Appropriate and Oriented  Insight:  Engaged  Engagement in Therapy:  Engaged  Modes of Intervention:   Education, Support and Processing, Activity  [employer], LCSW 10/11/2013, 12:15pm

## 2013-10-11 NOTE — Progress Notes (Signed)
Psychoeducational Group Note  Date:  10/11/2013 Time:  1015  Group Topic/Focus:  Making Healthy Choices:   The focus of this group is to help patients identify negative/unhealthy choices they were using prior to admission and identify positive/healthier coping strategies to replace them upon discharge.  Participation Level:  Active  Participation Quality:  Appropriate  Affect:  Appropriate  Cognitive:  Oriented  Insight:  Improving  Engagement in Group:  Engaged  Additional Comments:  Pt has been very much a part of the group with interaction, comments insights and questions.  Paulino Rily 10/11/2013

## 2013-10-11 NOTE — Progress Notes (Signed)
[place] Group Notes:  (Nursing/MHT/Case Management/Adjunct)  Date:  10/11/2013  Time:  8:00 p.m.   Type of Therapy:  Psychoeducational Skills  Participation Level:  Active  Participation Quality:  Appropriate  Affect:  Appropriate  Cognitive:  Appropriate  Insight:  Appropriate  Engagement in Group:  Developing/Improving  Modes of Intervention:  Education  Summary of Progress/Problems: The patient indicated that she had a slow day today. She is complaining of swelling in her arm which she states is being treated with ibuprofen. On a more positive note, the patient mentioned that she enjoyed the groups that were held earlier in the day. In terms of the theme of the day, she indicated that her son and two friends will make up her support system.   Archie Balboa S 10/11/2013, 9:57 PM

## 2013-10-11 NOTE — Progress Notes (Signed)
D) Pt has attended all the groups, has partisipated fully and engages appropriately with her peers. Pt states she feels that she has learned a lot of new coping skills and a lot of information about herself. Rates her depression at a 3 and her hopelessness at a 1. Denies SI and HI. Continues to journal her issues and feelings A) Given support and reassurance along with praise. Encouraged to continue to work on her issues after leaving here. Much encouragement given. R) Denies SI and HI.

## 2013-10-11 NOTE — Progress Notes (Signed)
Patient ID: Theresa Castillo, female   DOB: [birth year], 48 y.o.   MRN: [id number] [facility] MD Progress Note  10/11/2013 3:23 PM Theresa Castillo  MRN:  [id number] Subjective:  Patient engages easily.  Her son and mother visited and it went well.  Sleep was "pretty good",appetite is "fair".  She feels her depression is "leveling off".  Theresa Castillo requested ibuprofen instead of acetaminophen for headaches when she has them.  She continues to journal. 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 Allergy     pineapple abd pain and diarrhea  . Anxiety   . Depression   . Thyroid disease     borderline hypo  . Ulcer     born with stomach ulcer  . Hyperlipidemia   . 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 Chronic pain of right wrist   . Abnormal Pap smear   . Sleep apnea    Axis IV: other psychosocial or environmental problems, problems related to social environment and problems with primary support group Axis V: 41-50 serious symptoms  ADL's:  Intact  Sleep: Good  Appetite:  Good  Suicidal Ideation:  Plan:  vague  Intent:  none Means:  none Homicidal Ideation:  Denies   Psychiatric Specialty Exam: Review of Systems  Constitutional: Negative.   HENT: Negative.   Eyes: Negative.   Respiratory: Negative.   Cardiovascular: Negative.   Gastrointestinal: Negative.   Genitourinary: Negative.   Musculoskeletal: Negative.   Skin: Negative.   Neurological: Negative.   Endo/Heme/Allergies: Negative.   Psychiatric/Behavioral: Positive for depression. The patient is nervous/anxious.     Blood pressure 124/77, pulse 93, temperature 97.4 F (36.3 C), temperature source Oral, resp. rate 17, height 5' 4.25" (1.632 m), weight 109.77 kg (242 lb), SpO2 98.00%.Body mass index is 41.21 kg/(m^2).  General Appearance: Casual  Eye Contact::  Fair  Speech:  Normal Rate  Volume:  Normal  Mood:   Anxious  Affect:  Congruent  Thought Process:  Coherent  Orientation:  Full (Time, Place, and Person)  Thought Content:  WDL  Suicidal Thoughts:  Yes.  without intent/plan  Homicidal Thoughts:  No  Memory:  Immediate;   Fair Recent;   Fair Remote;   Fair  Judgement:  Fair  Insight:  Fair  Psychomotor Activity:  EPS  Concentration:  Fair  Recall:  Fair  Akathisia:  No  Handed:  Right  AIMS (if indicated):     Assets:  Leisure Time Physical Health Resilience Social Support  Sleep:  Number of Hours: 6.5   Current Medications: Current Facility-Administered Medications  Medication Dose Route Frequency Provider Last Rate Last Dose  . acetaminophen (TYLENOL) tablet 650 mg  650 mg Oral Q6H PRN Lurena Nida, NP   650 mg at 10/11/13 1320  . alum & mag hydroxide-simeth (MAALOX/MYLANTA) 200-200-20 MG/5ML suspension 30 mL  30 mL Oral Q4H PRN Lurena Nida, NP      . amphetamine-dextroamphetamine (ADDERALL) tablet 20 mg  20 mg Oral Q breakfast Waylan Boga, NP   20 mg at 10/11/13 0841  . aspirin EC tablet 81 mg  81 mg Oral Daily Waylan Boga, NP   81 mg at 10/11/13 0843  . buPROPion (WELLBUTRIN XL) 24 hr tablet 150 mg  150 mg Oral Daily Lurena Nida, NP   150 mg at 10/11/13 0841  . folic  acid (FOLVITE) tablet 2 mg  2 mg Oral q morning - 10a Lurena Nida, NP   2 mg at 10/11/13 0845  . levothyroxine (SYNTHROID, LEVOTHROID) tablet 50 mcg  50 mcg Oral Daily Lurena Nida, NP   50 mcg at 10/11/13 0841  . magnesium hydroxide (MILK OF MAGNESIA) suspension 30 mL  30 mL Oral Daily PRN Lurena Nida, NP      . nicotine (NICODERM CQ - dosed in mg/24 hours) patch 21 mg  21 mg Transdermal Daily Lurena Nida, NP   21 mg at 10/11/13 0841  . OLANZapine (ZYPREXA) tablet 5 mg  5 mg Oral QHS Lurena Nida, NP   5 mg at 10/10/13 2108  . pantoprazole (PROTONIX) EC tablet 20 mg  20 mg Oral Daily Waylan Boga, NP   20 mg at 10/11/13 0843  . phenol (CHLORASEPTIC) mouth spray 1 spray  1 spray Mouth/Throat PRN  Lurena Nida, NP   1 spray at 10/09/13 2208  . venlafaxine XR (EFFEXOR-XR) 24 hr capsule 150 mg  150 mg Oral Q breakfast Durward Parcel, MD   150 mg at 10/11/13 0848    Lab Results: No results found for this or any previous visit (from the past 23 hour(s)).  Physical Findings: AIMS: Facial and Oral Movements Muscles of Facial Expression: None, normal Lips and Perioral Area: None, normal Jaw: None, normal Tongue: None, normal,Extremity Movements Upper (arms, wrists, hands, fingers): None, normal Lower (legs, knees, ankles, toes): None, normal, Trunk Movements Neck, shoulders, hips: None, normal, Overall Severity Severity of abnormal movements (highest score from questions above): None, normal Incapacitation due to abnormal movements: None, normal Patient's awareness of abnormal movements (rate only patient's report): No Awareness, Dental Status Current problems with teeth and/or dentures?: No Does patient usually wear dentures?: No  CIWA:    COWS:     Treatment Plan Summary: Daily contact with patient to assess and evaluate symptoms and progress in treatment Medication management  Plan:  Review of chart, vital signs, medications, and notes. 1-Individual and group therapy 2-Medication management for depression and anxiety:  Medications reviewed with the patient and acetaminophen discontinued, ibuprofen PRN ordered for headaches  3-Coping skills for depression, anxiety 4-Continue crisis stabilization and management 5-Address health issues--monitoring vital signs, stable 6-Treatment plan in progress to prevent relapse of depression and anxiety  Medical Decision Making Problem Points:  Established problem, stable/improving (1) and Review of psycho-social stressors (1) Data Points:  Review of new medications or change in dosage (2)  I certify that inpatient services furnished can reasonably be expected to improve the patient's condition.   Waylan Boga,  [place] 10/11/2013, 3:23 PM Agree with assessment and plan Geralyn Flash A. Sabra Heck, M.D.

## 2013-10-12 NOTE — BHH Group Notes (Signed)
[facility] LCSW Aftercare Discharge Planning Group Note   10/12/2013 10:46 AM    Participation Quality:  Appropraite  Mood/Affect:  Appropriate  Depression Rating:  2  Anxiety Rating:  1  Thoughts of Suicide:  No  Will you contract for safety?   NA  Current AVH:  No  Plan for Discharge/Comments:  Patient attended discharge planning group and actively participated in group.  She reports doing much better and hopes to discharge today.  Patient will follow up with [facility].  CSW provided all participants with daily workbook.   Transportation Means: Patient has transportation.   Supports:  Patient has a support system.   Kadia Abaya, Eulas Post

## 2013-10-12 NOTE — Progress Notes (Signed)
Recreation Therapy Notes  Date: 01.05.2015 Time: 3:00pm Location: [address] Dayroom   Group Topic: Wellness  Goal Area(s) Addresses:  Patient will define components of whole wellness. Patient will verbalize benefit of whole wellness.  Behavioral Response: Appropriate  Intervention: Mind Map.  Activity: As a group patients were asked to identify and define the eight components of whole wellness - Physical, Mental, Emotional, Social, Environmental, Intellectual, Leisure, and Spiritual. Individually patients were asked to identify ways they are investing in each component of whole wellness.   Education: Wellness, Dentist, Coping Skills  Education Outcome: Acknowledges understanding   Clinical Observations/Feedback: Patient actively engaged in group session, completing worksheet as requested and seeking guidance and assistance from LRT as needed. Patient participated in group discussion about the importance of balancing these components to maintain whole wellness.   Laureen Ochs Reginna Sermeno, LRT/CTRS  Lane Hacker 10/12/2013 4:35 PM

## 2013-10-12 NOTE — BHH Suicide Risk Assessment (Signed)
[place] INPATIENT:  Family/Significant Other Suicide Prevention Education  Suicide Prevention Education:  Education Completed; Saunders Glance, Son, [phone];  has been identified by the patient as the family member/significant other with whom the patient will be residing, and identified as the person(s) who will aid the patient in the event of a mental health crisis (suicidal ideations/suicide attempt).  With written consent from the patient, the family member/significant other has been provided the following suicide prevention education, prior to the and/or following the discharge of the patient.  Son advised he is very aware of Suicide Prevention Education.  The suicide prevention education provided includes the following:  Suicide risk factors  Suicide prevention and interventions  National Suicide Hotline telephone number  [facility] assessment telephone number  [facility] Emergency Assistance [place] and/or Residential Mobile Crisis Unit telephone number  Request made of family/significant other to:  Remove weapons (e.g., guns, rifles, knives), all items previously/currently identified as safety concern.  Son advised patient does not have access to guns.  Remove drugs/medications (over-the-counter, prescriptions, illicit drugs), all items previously/currently identified as a safety concern.  The family member/significant other verbalizes understanding of the suicide prevention education information provided.  The family member/significant other agrees to remove the items of safety concern listed above.  Concha Pyo 10/12/2013, 11:43 AM

## 2013-10-12 NOTE — BHH Group Notes (Signed)
[place] LCSW Group Therapy          Overcoming Obstacles       1:15 -2:30        10/12/2013   2:35 PM     Type of Therapy:  Group Therapy  Participation Level:  Appropriate  Participation Quality:  Appropriate  Affect:  Appropriate, Alert  Cognitive:  Attentive Appropriate  Insight: Developing/Improving Engaged  Engagement in Therapy: Developing/Imprvoing Engaged  Modes of Intervention:  Discussion Exploration  Education Rapport BuildingProblem-Solving Support  Summary of Progress/Problems:  The main focus of today's group was overcoming obstacles.  She shared the obstacle she has to overcome is eye surgery on Wednesday.  Patient shared concerns of whether or not the surgery will be successful.     Theresa Castillo 10/12/2013    2:35 PM

## 2013-10-12 NOTE — Progress Notes (Signed)
D: Patient in the dayroom on approach.  Patient states she had a wonderful day.  Patient states she is happy that she gets to go home tomorrow.  Patient states she feel like she has gotten some help while here.  Patient denies SI/HI and denies AVH.  Patient states when she is discharged she is going to see about the swelling she has had in her right arm for sometime.  Patient states she gets pain in her right shoulder from time to time.   A: Staff to monitor Q 15 mins for safety.  Encouragement and support offered.  Scheduled medications administered per orders.  Ibuprofen administered prn for pain in right shoulder.  Vistaril administered prn for sleep. R: Patient remains safe on the unit.  Patient attended group tonight.  Patient visible on the unit.  Patient taking administered medications.

## 2013-10-12 NOTE — Progress Notes (Addendum)
D:  Patient's self inventory sheet, patient has fair sleep, good appetite, normal energy level, improving attention span.  Rated depression 2, anxiety and hopeless #1.  Denied withdrawals.  Denied SI.  Has felt pain in past 24 hours, worst pain #3, pain goal #2.  After discharge, plans to adopt cat, go to support gorup, continue counseling, apply for assistance.  Surgery scheduled for Wednesday is of concern.  Therapy appointment at 3 p.m. And wish to make phone call.  Does have discharge plans.  No problems taking meds after discharge.  Financial assistance may be needed for medication. A:  Medicaitons administered per MD orders.  Emotional support and encouragement given patient. R:  Denied SI and HI.  Denied A/V hallucinations.  Will continue to monitor patient for safety with 15 minute checks.  Safety maintained.

## 2013-10-12 NOTE — Tx Team (Signed)
Interdisciplinary Treatment Plan Update   Date Reviewed:  10/12/2013  Time Reviewed:  9:42 AM  Progress in Treatment:   Attending groups: Yes Participating in groups: Yes Taking medication as prescribed: Yes  Tolerating medication: Yes Family/Significant other contact made: Yes, collateral contact made with son. Patient understands diagnosis: Yes  Discussing patient identified problems/goals with staff: Yes Medical problems stabilized or resolved: Yes Denies suicidal/homicidal ideation: Yes Patient has not harmed self or others: Yes  For review of initial/current patient goals, please see plan of care.  Estimated Length of Stay:  1 day  Reasons for Continued Hospitalization:  Anxiety Depression Medication stabilization  New Problems/Goals identified:    Discharge Plan or Barriers:   Home with outpatient follow up [facility]  Additional Comments:  N/A   Attendees:  Patient:  10/12/2013 9:42 AM   Signature: Mylinda Latina, MD 10/12/2013 9:42 AM  Signature: Catalina Pizza, FNP  10/12/2013 9:42 AM  Signature:  Thurnell Garbe, RN 10/12/2013 9:42 AM  Signature:  Grayland Ormond, RN 10/12/2013 9:42 AM  Signature:   Norberto Sorenson, Care Coordinator [facility] 10/12/2013 9:42 AM  Signature:  Joette Catching, LCSW 10/12/2013 9:42 AM  Signature:  Regan Lemming, LCSW 10/12/2013 9:42 AM  Signature:  Lucinda Dell, Care Coordinator [facility] 10/12/2013 9:42 AM  Signature:   10/12/2013 9:42 AM  Signature: Maureen Chatters, RN  10/12/2013  9:42 AM  Signature:    10/12/2013  9:42 AM  Signature:   10/12/2013  9:42 AM    Scribe for Treatment Team:   Joette Catching,  10/12/2013 9:42 AM

## 2013-10-12 NOTE — Progress Notes (Signed)
Recreation Therapy Notes  INPATIENT RECREATION THERAPY ASSESSMENT  Patient Stressors: Family, Relationship, Death, Friends, Work, Banker: Isolate, Arguments, Exercise, Art/Dance, Talking, Music  Leisure Interests: [place], Teaching laboratory technician (social media), Exercise, Family Activities, Environmental education officer, Licensed conveyancer to Music, Geneticist, molecular, Playing a Microbiologist,  Reading, Shopping, Sports, [employer], Programmer, multimedia, [employer], Walking, Writing, Other:  Personal Challenges: Anger, Communication, Concentration, Decision-Making, Expressing Yourself, Problem-Solving, Relationships, School Performances, Self-Esteem/Confidence, Social Interaction, Stress Management, Time Management, Trusting Others, Work Performance/Other:  Patient indicated she does not have a physical disability that would prevent participating in recreation therapy group sessions.  Patient listed the following strengths:  NONE  Patient indicated she would like to change the following about herself: Self-Esteem, School Performance  Patient listed the following current recreation interests: Shopping  Patient goal for hospitalization: Learn  Lane Hacker, LRT/CTRS  Lane Hacker 10/12/2013 4:32 PM

## 2013-10-12 NOTE — Progress Notes (Signed)
Patient ID: Theresa Castillo, female   DOB: 11-22-1965, 48 y.o.   MRN: [id number] Patient ID: Theresa Castillo, female   DOB: 14-Mar-1966, 48 y.o.   MRN: [id number] [facility] MD Progress Note  10/12/2013 12:56 PM Theresa Castillo  MRN:  [id number]  Subjective:  Patient has been anxious about disposition plans and surgery on her eye which was planned for Wednesday. Patient has minimized her symptoms of mood disorder at this time patient reported she has been actively participating in unit activities including groups and being been treated from learning coping skills and getting support from the group. Patient has no disturbance of sleep and appetite. Patient stated she is feeling better without much depression. Patient takes ibuprofen instead of acetaminophen for headaches and sweating of her right arm.    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 Allergy     pineapple abd pain and diarrhea  . Anxiety   . Depression   . Thyroid disease     borderline hypo  . Ulcer     born with stomach ulcer  . Hyperlipidemia   . 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 Chronic pain of right wrist   . Abnormal Pap smear   . Sleep apnea    Axis IV: other psychosocial or environmental problems, problems related to social environment and problems with primary support group Axis V: 41-50 serious symptoms  ADL's:  Intact  Sleep: Good  Appetite:  Good  Suicidal Ideation:  Plan:  vague  Intent:  none Means:  none Homicidal Ideation:  Denies   Psychiatric Specialty Exam: Review of Systems  Constitutional: Negative.   HENT: Negative.   Eyes: Negative.   Respiratory: Negative.   Cardiovascular: Negative.   Gastrointestinal: Negative.   Genitourinary: Negative.   Musculoskeletal: Negative.   Skin: Negative.   Neurological: Negative.   Endo/Heme/Allergies: Negative.   Psychiatric/Behavioral:  Positive for depression. The patient is nervous/anxious.     Blood pressure 115/79, pulse 97, temperature 97.5 F (36.4 C), temperature source Oral, resp. rate 18, height 5' 4.25" (1.632 m), weight 109.77 kg (242 lb), SpO2 98.00%.Body mass index is 41.21 kg/(m^2).  General Appearance: Casual  Eye Contact::  Fair  Speech:  Normal Rate  Volume:  Normal  Mood:  Anxious  Affect:  Congruent  Thought Process:  Coherent  Orientation:  Full (Time, Place, and Person)  Thought Content:  WDL  Suicidal Thoughts:  Yes.  without intent/plan  Homicidal Thoughts:  No  Memory:  Immediate;   Fair Recent;   Fair Remote;   Fair  Judgement:  Fair  Insight:  Fair  Psychomotor Activity:  EPS  Concentration:  Fair  Recall:  Fair  Akathisia:  No  Handed:  Right  AIMS (if indicated):     Assets:  Leisure Time Physical Health Resilience Social Support  Sleep:  Number of Hours: 2.5   Current Medications: Current Facility-Administered Medications  Medication Dose Route Frequency Provider Last Rate Last Dose  . alum & mag hydroxide-simeth (MAALOX/MYLANTA) 200-200-20 MG/5ML suspension 30 mL  30 mL Oral Q4H PRN Lurena Nida, NP      . amphetamine-dextroamphetamine (ADDERALL) tablet 20 mg  20 mg Oral Q breakfast Waylan Boga, NP   20 mg at 10/12/13 0816  . aspirin EC tablet 81 mg  81 mg Oral Daily Waylan Boga, NP  81 mg at 10/12/13 0815  . buPROPion (WELLBUTRIN XL) 24 hr tablet 150 mg  150 mg Oral Daily Lurena Nida, NP   150 mg at 10/12/13 0815  . folic acid (FOLVITE) tablet 2 mg  2 mg Oral q morning - 10a Lurena Nida, NP   2 mg at 10/12/13 1134  . hydrOXYzine (ATARAX/VISTARIL) tablet 50 mg  50 mg Oral QHS PRN Lurena Nida, NP   50 mg at 10/11/13 2207  . ibuprofen (ADVIL,MOTRIN) tablet 600 mg  600 mg Oral Q6H PRN Waylan Boga, NP   600 mg at 10/12/13 0818  . levothyroxine (SYNTHROID, LEVOTHROID) tablet 50 mcg  50 mcg Oral Daily Lurena Nida, NP   50 mcg at 10/12/13 6789  . magnesium hydroxide  (MILK OF MAGNESIA) suspension 30 mL  30 mL Oral Daily PRN Lurena Nida, NP      . nicotine (NICODERM CQ - dosed in mg/24 hours) patch 21 mg  21 mg Transdermal Daily Lurena Nida, NP   21 mg at 10/12/13 3810  . OLANZapine (ZYPREXA) tablet 5 mg  5 mg Oral QHS Lurena Nida, NP   5 mg at 10/11/13 2207  . pantoprazole (PROTONIX) EC tablet 20 mg  20 mg Oral Daily Waylan Boga, NP   20 mg at 10/12/13 0814  . phenol (CHLORASEPTIC) mouth spray 1 spray  1 spray Mouth/Throat PRN Lurena Nida, NP   1 spray at 10/09/13 2208  . venlafaxine XR (EFFEXOR-XR) 24 hr capsule 150 mg  150 mg Oral Q breakfast Durward Parcel, MD   150 mg at 10/12/13 0815    Lab Results: No results found for this or any previous visit (from the past 28 hour(s)).  Physical Findings: AIMS: Facial and Oral Movements Muscles of Facial Expression: None, normal Lips and Perioral Area: None, normal Jaw: None, normal Tongue: None, normal,Extremity Movements Upper (arms, wrists, hands, fingers): None, normal Lower (legs, knees, ankles, toes): None, normal, Trunk Movements Neck, shoulders, hips: None, normal, Overall Severity Severity of abnormal movements (highest score from questions above): None, normal Incapacitation due to abnormal movements: None, normal Patient's awareness of abnormal movements (rate only patient's report): No Awareness, Dental Status Current problems with teeth and/or dentures?: No Does patient usually wear dentures?: No  CIWA:  CIWA-Ar Total: 1 COWS:  COWS Total Score: 2  Treatment Plan Summary: Daily contact with patient to assess and evaluate symptoms and progress in treatment Medication management  Plan:  Review of chart, vital signs, medications, and notes. 1-Individual and group therapy 2-Medication management for depression and anxiety:  Medications reviewed with the patient and acetaminophen discontinued, ibuprofen PRN ordered for headaches  3-Coping skills for depression,  anxiety 4-Continue crisis stabilization and management 5-Address health issues--monitoring vital signs, stable 6-Treatment plan in progress to prevent relapse of depression and anxiety 7. Disposition plans are in progress and patient may be discharged tomorrow when she can continue to contract for safety and a continued to improve clinically  Medical Decision Making Problem Points:  Established problem, stable/improving (1) and Review of psycho-social stressors (1) Data Points:  Review of new medications or change in dosage (2)  I certify that inpatient services furnished can reasonably be expected to improve the patient's condition.   Milbert Bixler,JANARDHAHA R. 10/12/2013, 12:56 PM

## 2013-10-13 MED ORDER — AMPHETAMINE-DEXTROAMPHETAMINE 20 MG PO TABS: 20.0000 mg | ORAL_TABLET | Freq: Every day | ORAL | Status: AC

## 2013-10-13 MED ORDER — ASPIRIN 81 MG PO TBEC: 81.0000 mg | DELAYED_RELEASE_TABLET | Freq: Every day | ORAL | Status: AC

## 2013-10-13 MED ORDER — BUPROPION HCL ER (XL) 150 MG PO TB24: 150.0000 mg | ORAL_TABLET | Freq: Every day | ORAL | Status: DC

## 2013-10-13 NOTE — Progress Notes (Addendum)
[facility] Discharge Plan :  Will you be returning to the same living situation after discharge: Yes,  home At discharge, do you have transportation home?:Yes,  son Do you have the ability to pay for your medications:Yes,  [employer] private insurance  Release of information consent forms completed and submitted to Medical Records by CSW.   Patient to Follow up at: Follow-up Information   Follow up with [facility] On 10/15/2013. (You are scheduled with Lajuana Ripple on Thursday, October 15, 2012 at 10:15 AM)    Contact information:   [address] [place], [place]   [address]  [phone]      Patient denies SI/HI:   Yes,  during group/self report.    Safety Planning and Suicide Prevention discussed:  Yes,  SPE completed with pt's son. SPI pamphlet provided to pt.   Smart, HeatherLCSWA  10/13/2013, 11:35 AM

## 2013-10-13 NOTE — Progress Notes (Signed)
Discharge Note:  Patient discharged home with son.  Denied SI and HI.  Denied A/V hallucinations.  Denied pain.  Patient received all her belongings, cell phone purpose case, cigarettes, keys, shoes, toiletries, miscellaneous items, cosmetics, clothing, prescriptions, medications.  Suicide prevention information given and discussed with patient who stated she understood and had no questions.  Patient stated she appreciated all assistance received from [facility] staff.

## 2013-10-13 NOTE — Progress Notes (Signed)
D:  Patient's self inventory sheet, patient sleeps well, good appetite, normal energy level, good attention span.  Rated depression and hopeless #1.  Denied withdrawals.  Denied SI.  Has felt pain in past 24 hours.  Pain goal today #2, worst pain #3.  After discharge, will live, gym, adopt cat, daily schedule, structural sleep pattern.  Does have discharge plans.  Short term feels OK but long term may need assistance. A:  Medications administered per MD orders.  Emotional support and encouragement given patient. R:  Denied SI and HI.  Denied A/V hallucinations.  Denied pain.  Will continue to monitor patient for safety with 15 minute checks.  Safety maintained.

## 2013-10-13 NOTE — Progress Notes (Signed)
The focus of this group is to educate the patient on the purpose and policies of crisis stabilization and provide a format to answer questions about their admission.  The group details unit policies and expectations of patients while admitted.  Patient attended 0900 nurse education orientation group this morning.  Patient listened attentively, appropriate affect, alert, appropriate insight and engagement.  Today patient will work on 3 goals for discharge.  
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
## 2013-10-13 NOTE — [facility] Suicide Risk Assessment (Signed)
Suicide Risk Assessment  Discharge Assessment     Demographic Factors:  Adolescent or young adult, Caucasian, Low socioeconomic status and Unemployed  Mental Status Per Nursing Assessment::   On Admission:     Current Mental Status by Physician: Patient is calm and cooperative. Patient has normal psychomotor activity. Patient has good mood with appropriate tube right and flair affect. Patient has normal rate rhythm and volume of speech. Patient has denied suicidal/homicidal ideations, intentions and plans. Patient has no evidence of psychotic symptoms.  Loss Factors: Decrease in vocational status and Financial problems/change in socioeconomic status  Historical Factors: Prior suicide attempts, Family history of mental illness or substance abuse and Impulsivity  Risk Reduction Factors:   Sense of responsibility to family, Religious beliefs about death, Living with another person, especially a relative, Positive social support, Positive therapeutic relationship and Positive coping skills or problem solving skills  Continued Clinical Symptoms:  Bipolar Disorder:   Depressive phase Depression:   Recent sense of peace/wellbeing Previous Psychiatric Diagnoses and Treatments Medical Diagnoses and Treatments/Surgeries  Cognitive Features That Contribute To Risk:  Polarized thinking    Suicide Risk:  Minimal: No identifiable suicidal ideation.  Patients presenting with no risk factors but with morbid ruminations; may be classified as minimal risk based on the severity of the depressive symptoms  Discharge Diagnoses:   AXIS I:  Bipolar, Depressed AXIS II:  Deferred AXIS III:   Past Medical History  Diagnosis Date  . Allergy     pineapple abd pain and diarrhea  . Anxiety   . Depression   . Thyroid disease     borderline hypo  . Ulcer     born with stomach ulcer  . Hyperlipidemia   . 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 Chronic pain of right wrist   . Abnormal Pap smear   .  Sleep apnea    AXIS IV:  economic problems, other psychosocial or environmental problems, problems related to social environment and problems with primary support group AXIS V:  61-70 mild symptoms  Plan Of Care/Follow-up recommendations:  Activity:  As tolerated Diet:  Regular  Is patient on multiple antipsychotic therapies at discharge:  No   Has Patient had three or more failed trials of antipsychotic monotherapy by history:  No  Recommended Plan for Multiple Antipsychotic Therapies: NA  Ivan Maskell,JANARDHAHA R. 10/13/2013, 12:02 PM

## 2013-10-15 ENCOUNTER — Encounter: Payer: Self-pay | Admitting: *Deleted

## 2013-10-16 NOTE — Progress Notes (Signed)
Patient Discharge Instructions:  After Visit Summary (AVS):   Faxed to:  10/16/13 Discharge Summary Note:   Faxed to:  10/16/13 Psychiatric Admission Assessment Note:   Faxed to:  10/16/13 Suicide Risk Assessment - Discharge Assessment:   Faxed to:  10/16/13 Faxed/Sent to the Next Level Care provider:  10/16/13 Faxed to [facility], 10/16/2013, 2:46 PM

## 2013-10-20 ENCOUNTER — Ambulatory Visit: Payer: Self-pay | Admitting: Family Medicine

## 2013-10-22 ENCOUNTER — Ambulatory Visit: Payer: BC Managed Care – PPO | Admitting: Family Medicine

## 2013-10-27 ENCOUNTER — Ambulatory Visit (INDEPENDENT_AMBULATORY_CARE_PROVIDER_SITE_OTHER): Payer: BC Managed Care – PPO | Admitting: Family Medicine

## 2013-10-27 ENCOUNTER — Encounter: Payer: Self-pay | Admitting: Family Medicine

## 2013-10-27 VITALS — BP 100/68 | HR 82 | Temp 97.2°F | Resp 16 | Ht 66.75 in | Wt 242.0 lb

## 2013-10-27 DIAGNOSIS — M7989 Other specified soft tissue disorders: Secondary | ICD-10-CM

## 2013-10-27 NOTE — Progress Notes (Signed)
Subjective:    Patient ID: Theresa Castillo, female    DOB: Apr 06, 1966, 48 y.o.   MRN: [id number]  HPI  Patient was hospitalized earlier this month for suicide attempt. She overdosed on Xanax. She was admitted to behavioral health for several days and is following up outpatient with her psychiatrist. However ever since being in the hospital, she reports swelling and pain in her right arm from her shoulder to her forearm. She reports significant swelling at times although it is not witnessed today on examination. She reports aching and throbbing pain from her armpits her elbow. Her symptoms are concerning for DVT following an IV or possibly cervical nerve impingement.  She did not followup with hem/onc as scheduled at her last office visit due to her hospitalization. I provided her with the number for Dr. Alvy Bimler so that she could call and reschedule the appointment. Past Medical History  Diagnosis Date  . Allergy     pineapple abd pain and diarrhea  . Anxiety   . Depression   . Thyroid disease     borderline hypo  . Ulcer     born with stomach ulcer  . Hyperlipidemia   . 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 Chronic pain of right wrist   . Abnormal Pap smear   . Sleep apnea    Past Surgical History  Procedure Laterality Date  . Cholecystectomy  1995  . Salpingoophorectomy  1984    right  . Ankle surgery  2003    right  . Pilonial cyst removed twice    . Tubal ligation    . Oophorectomy    . Uterine fibroid surgery    . Breast biopsy  08/22/2011    Procedure: BREAST BIOPSY WITH NEEDLE LOCALIZATION;  Surgeon: Adin Hector, MD;  Location: [facility];  Service: General;  Laterality: Left;  Left  BREAST DUCT EXCISION & PARATIAL MASTECTOMY W/NEEDLE LOCALIZATION  . Gum surgery     Current Outpatient Prescriptions on File Prior to Visit  Medication Sig Dispense Refill  . amphetamine-dextroamphetamine (ADDERALL) 20 MG tablet Take 1 tablet (20 mg total) by mouth daily with  breakfast.  7 tablet  0  . aspirin EC 81 MG EC tablet Take 1 tablet (81 mg total) by mouth daily.      Marland Kitchen buPROPion (WELLBUTRIN XL) 150 MG 24 hr tablet Take 1 tablet (150 mg total) by mouth daily.  30 tablet  0  . folic acid (FOLVITE) 1 MG tablet Take 2 mg by mouth every morning.       Marland Kitchen levothyroxine (SYNTHROID, LEVOTHROID) 50 MCG tablet Take 50 mcg by mouth every morning.      . nicotine (NICODERM CQ - DOSED IN MG/24 HOURS) 21 mg/24hr patch Place 1 patch (21 mg total) onto the skin daily.  28 patch  0  . OLANZapine (ZYPREXA) 5 MG tablet Take 5 mg by mouth at bedtime.      Marland Kitchen venlafaxine XR (EFFEXOR-XR) 150 MG 24 hr capsule Take 150 mg by mouth daily with breakfast.       No current facility-administered medications on file prior to visit.   Allergies  Allergen Reactions  . Ammonia Other (See Comments)    Severe migraines  . Amoxicillin Other (See Comments)    syncope  . Topamax Other (See Comments)    Caused neurological deficits, confusion, bad intentions   . Pineapple Nausea Only  . Artichoke Adela Glimpse Scolymus (Artichoke)]  Tongue swells up  . Benadryl [Diphenhydramine Hcl (Sleep)]     *can take childrens dose, but when takes adult dose, patient cannot remember anything for 3 days*  . Paxil [Paroxetine Hcl]     *started seeing double*  . Latex Rash and Other (See Comments)    Cracking of skin  . Trazodone And Nefazodone Other (See Comments)    unknown   History   Social History  . Marital Status: Married    Spouse Name: N/A    Number of Children: N/A  . Years of Education: N/A   Occupational History  . Not on file.   Social History Main Topics  . Smoking status: Current Every Day Smoker -- 1.00 packs/day for 30 years    Types: Cigarettes  . Smokeless tobacco: Never Used     Comment: never done snuff or chewing tobacco.  . Alcohol Use: Yes     Comment: rarely  . Drug Use: No  . Sexual Activity: Yes    Birth Control/ Protection: Post-menopausal   Other Topics  Concern  . Not on file   Social History Narrative  . No narrative on file     Review of Systems  All other systems reviewed and are negative.       Objective:   Physical Exam  Vitals reviewed. Constitutional: She is oriented to person, place, and time.  Cardiovascular: Normal rate, regular rhythm and normal heart sounds.   No murmur heard. Pulmonary/Chest: Effort normal and breath sounds normal. No respiratory distress. She has no wheezes. She has no rales.  Musculoskeletal: She exhibits no edema.  Neurological: She is alert and oriented to person, place, and time. She displays normal reflexes. No cranial nerve deficit. She exhibits normal muscle tone. Coordination normal.   patient has a positive Spurling's maneuver on the right side. She also has pain with empty can maneuver in the right shoulder        Assessment & Plan:  1. Swelling in right armpit Given the recent hospitalization, and the instrumentation in her right arm, I will obtain an ultrasound to rule out a DVT in the upper extremity. Her examination today does not seem concerning for this however. If the ultrasound is normal, my best explanation would be possible cervical radiculopathy.  We could perform an MRI of the cervical spine to evaluate further. I also provided the patient with the telephone number for the oncologist so that she could call and reschedule her appointment. - US Venous Img Upper Bilat; Future

## 2013-10-28 ENCOUNTER — Telehealth: Payer: Self-pay | Admitting: Hematology and Oncology

## 2013-10-28 NOTE — Telephone Encounter (Signed)
C/D 10/28/13 for appt. 11/06/13

## 2013-10-29 ENCOUNTER — Ambulatory Visit
Admission: RE | Admit: 2013-10-29 | Discharge: 2013-10-29 | Disposition: A | Discharge status: Home / Self Care | Payer: BC Managed Care – PPO | Source: Ambulatory Visit | Attending: Family Medicine | Admitting: Family Medicine

## 2013-10-29 DIAGNOSIS — M7989 Other specified soft tissue disorders: Secondary | ICD-10-CM

## 2013-11-06 ENCOUNTER — Ambulatory Visit (HOSPITAL_BASED_OUTPATIENT_CLINIC_OR_DEPARTMENT_OTHER): Payer: BC Managed Care – PPO

## 2013-11-06 ENCOUNTER — Ambulatory Visit (HOSPITAL_BASED_OUTPATIENT_CLINIC_OR_DEPARTMENT_OTHER): Payer: BC Managed Care – PPO | Admitting: Hematology and Oncology

## 2013-11-06 ENCOUNTER — Telehealth: Payer: Self-pay | Admitting: Hematology and Oncology

## 2013-11-06 ENCOUNTER — Encounter: Payer: Self-pay | Admitting: Hematology and Oncology

## 2013-11-06 ENCOUNTER — Ambulatory Visit: Payer: BC Managed Care – PPO

## 2013-11-06 VITALS — BP 120/80 | HR 93 | Temp 97.1°F | Resp 20 | Ht 66.75 in | Wt 248.5 lb

## 2013-11-06 DIAGNOSIS — D649 Anemia, unspecified: Secondary | ICD-10-CM

## 2013-11-06 DIAGNOSIS — F17200 Nicotine dependence, unspecified, uncomplicated: Secondary | ICD-10-CM

## 2013-11-06 DIAGNOSIS — D72829 Elevated white blood cell count, unspecified: Secondary | ICD-10-CM

## 2013-11-06 HISTORY — DX: Nicotine dependence, unspecified, uncomplicated: F17.200

## 2013-11-06 LAB — CBC WITH DIFFERENTIAL/PLATELET
BASO%: 1.2 % (ref 0.0–2.0)
Basophils Absolute: 0.2 10*3/uL — ABNORMAL HIGH (ref 0.0–0.1)
EOS%: 2.7 % (ref 0.0–7.0)
Eosinophils Absolute: 0.4 10*3/uL (ref 0.0–0.5)
HCT: 40.1 % (ref 34.8–46.6)
HGB: 13.4 g/dL (ref 11.6–15.9)
LYMPH%: 24.8 % (ref 14.0–49.7)
MCH: 31.4 pg (ref 25.1–34.0)
MCHC: 33.5 g/dL (ref 31.5–36.0)
MCV: 93.7 fL (ref 79.5–101.0)
MONO#: 0.7 10*3/uL (ref 0.1–0.9)
MONO%: 5.3 % (ref 0.0–14.0)
NEUT#: 8.7 10*3/uL — ABNORMAL HIGH (ref 1.5–6.5)
NEUT%: 66 % (ref 38.4–76.8)
Platelets: 275 10*3/uL (ref 145–400)
RBC: 4.28 10*6/uL (ref 3.70–5.45)
RDW: 14.2 % (ref 11.2–14.5)
WBC: 13.2 10*3/uL — ABNORMAL HIGH (ref 3.9–10.3)
lymph#: 3.3 10*3/uL (ref 0.9–3.3)

## 2013-11-06 LAB — MORPHOLOGY
PLT EST: ADEQUATE
RBC Comments: NORMAL

## 2013-11-06 LAB — SEDIMENTATION RATE: Sed Rate: 30 mm/hr — ABNORMAL HIGH (ref 0–22)

## 2013-11-06 LAB — CHCC SMEAR

## 2013-11-06 MED ORDER — NICOTINE 21 MG/24HR TD PT24: 21.0000 mg | MEDICATED_PATCH | Freq: Every day | TRANSDERMAL | Status: AC

## 2013-11-06 NOTE — Progress Notes (Signed)
Checked in new patient with no financial issues. She has appt card. °

## 2013-11-06 NOTE — Progress Notes (Signed)
[facility] NOTE  Patient Care Team: Susy Frizzle, MD as PCP - General (Family Medicine)  CHIEF COMPLAINTS/PURPOSE OF CONSULTATION:  Chronic leukocytosis  HISTORY OF PRESENTING ILLNESS:  Theresa Castillo 48 y.o. female is here because of elevated WBC.  She was found to have abnormal CBC from 2009. Her white blood cell count has been high from 14 to as high as 17.2 She denies recent infection. The last prescription antibiotics was more than 1 month ago There is not reported symptoms of sinus congestion, cough, urinary frequency/urgency or dysuria, diarrhea, joint swelling/pain or abnormal skin rash.  She had no prior history or diagnosis of cancer. Her age appropriate screening programs are up-to-date. The patient has no prior diagnosis of autoimmune disease and was not prescribed corticosteroids related products. The patient is a smoker and currently smokes 1 pack of cigarettes per day for the last 30 years.  MEDICAL HISTORY:  Past Medical History  Diagnosis Date  . Allergy     pineapple abd pain and diarrhea  . Anxiety   . Depression   . Thyroid disease     borderline hypo  . Ulcer     born with stomach ulcer  . Hyperlipidemia   . 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 Chronic pain of right wrist   . Abnormal Pap smear   . Sleep apnea   . Tobacco dependency 11/06/2013    SURGICAL HISTORY: Past Surgical History  Procedure Laterality Date  . Cholecystectomy  1995  . Salpingoophorectomy  1984    right  . Ankle surgery  2003    right  . Pilonial cyst removed twice    . Tubal ligation    . Oophorectomy    . Uterine fibroid surgery    . Breast biopsy  08/22/2011    Procedure: BREAST BIOPSY WITH NEEDLE LOCALIZATION;  Surgeon: Adin Hector, MD;  Location: [facility];  Service: General;  Laterality: Left;  Left  BREAST DUCT EXCISION & PARATIAL MASTECTOMY W/NEEDLE LOCALIZATION  . Gum surgery      SOCIAL HISTORY: History   Social History  .  Marital Status: Married    Spouse Name: N/A    Number of Children: N/A  . Years of Education: N/A   Occupational History  . Not on file.   Social History Main Topics  . Smoking status: Current Every Day Smoker -- 1.00 packs/day for 30 years    Types: Cigarettes  . Smokeless tobacco: Never Used     Comment: never done snuff or chewing tobacco.  . Alcohol Use: Yes     Comment: rarely  . Drug Use: No  . Sexual Activity: Yes    Birth Control/ Protection: Post-menopausal   Other Topics Concern  . Not on file   Social History Narrative  . No narrative on file    FAMILY HISTORY: Family History  Problem Relation Age of Onset  . Colon cancer Father   . Heart disease Father   . Heart attack Father   . Cancer Mother     breast/lukemia  . Cancer Maternal Aunt     breast and thyroid  . Cancer Paternal Aunt     breast  . Cancer Maternal Grandmother     colon    ALLERGIES:  is allergic to ammonia; amoxicillin; artichoke; pineapple; topamax; benadryl; paxil; and latex.  MEDICATIONS:  Current Outpatient Prescriptions  Medication Sig Dispense Refill  . amphetamine-dextroamphetamine (ADDERALL) 20 MG tablet  Take 1 tablet (20 mg total) by mouth daily with breakfast.  7 tablet  0  . aspirin EC 81 MG EC tablet Take 1 tablet (81 mg total) by mouth daily.      Marland Kitchen buPROPion (WELLBUTRIN XL) 150 MG 24 hr tablet Take 300 mg by mouth daily.      . folic acid (FOLVITE) 1 MG tablet Take 2 mg by mouth every morning.       Marland Kitchen levothyroxine (SYNTHROID, LEVOTHROID) 50 MCG tablet Take 50 mcg by mouth every morning.      Marland Kitchen OLANZapine (ZYPREXA) 5 MG tablet Take 5 mg by mouth at bedtime.      . traZODone (DESYREL) 50 MG tablet Take 50 mg by mouth at bedtime.      Marland Kitchen venlafaxine XR (EFFEXOR-XR) 150 MG 24 hr capsule Take 150 mg by mouth daily with breakfast.      . nicotine (NICODERM CQ - DOSED IN MG/24 HOURS) 21 mg/24hr patch Place 1 patch (21 mg total) onto the skin daily.  28 patch  0   No current  facility-administered medications for this visit.    REVIEW OF SYSTEMS:   Constitutional: Denies fevers, chills or abnormal night sweats Eyes: Denies blurriness of vision, double vision or watery eyes Ears, nose, mouth, throat, and face: Denies mucositis or sore throat Respiratory: Denies cough, dyspnea or wheezes Cardiovascular: Denies palpitation, chest discomfort or lower extremity swelling Gastrointestinal:  Denies nausea, heartburn or change in bowel habits Skin: Denies abnormal skin rashes Lymphatics: Denies new lymphadenopathy or easy bruising Neurological:Denies numbness, tingling or new weaknesses Behavioral/Psych: Mood is stable, no new changes  All other systems were reviewed with the patient and are negative.  PHYSICAL EXAMINATION: ECOG PERFORMANCE STATUS: 0 - Asymptomatic  Filed Vitals:   11/06/13 1013  BP: 120/80  Pulse: 93  Temp: 97.1 F (36.2 C)  Resp: 20   Filed Weights   11/06/13 1013  Weight: 248 lb 8 oz (112.719 kg)    GENERAL:alert, no distress and comfortable. She is morbidly obese SKIN: skin color, texture, turgor are normal, no rashes or significant lesions EYES: normal, conjunctiva are pink and non-injected, sclera clear OROPHARYNX:no exudate, no erythema and lips, buccal mucosa, and tongue normal  NECK: supple, thyroid normal size, non-tender, without nodularity LYMPH:  no palpable lymphadenopathy in the cervical, axillary or inguinal LUNGS: clear to auscultation and percussion with normal breathing effort HEART: regular rate & rhythm and no murmurs and no lower extremity edema ABDOMEN:abdomen soft, non-tender and normal bowel sounds Musculoskeletal:no cyanosis of digits and no clubbing  PSYCH: alert & oriented x 3 with fluent speech NEURO: no focal motor/sensory deficits  LABORATORY DATA:  I have reviewed the data as listed Lab Results  Component Value Date   WBC 15.0* 10/07/2013   HGB 11.6* 10/07/2013   HCT 34.9* 10/07/2013   MCV 94.1  10/07/2013   PLT 255 10/07/2013     RADIOGRAPHIC STUDIES: I have personally reviewed the radiological images as listed and agreed with the findings in the report. Dg Chest 2 View  10/08/2013   CLINICAL DATA:  Pneumonia.  EXAM: CHEST  2 VIEW  COMPARISON:  10/06/2013  FINDINGS: Lungs are adequately inflated and demonstrate patchy bibasilar opacification which is mostly of linear morphology likely atelectasis, although cannot exclude infection. No definite effusion. Cardiomediastinal silhouette and remainder of the exam is unchanged.  IMPRESSION: Bibasilar opacification predominately linear suggesting atelectasis, although cannot exclude infection.   Electronically Signed   By: Marin Olp M.D.  On: 10/08/2013 08:36   US Venous Img Upper Uni Right  10/29/2013   CLINICAL DATA:  Swelling, pain, numbness  EXAM: Right UPPER EXTREMITY VENOUS DOPPLER ULTRASOUND  TECHNIQUE: Gray-scale sonography with graded compression, as well as color Doppler and duplex ultrasound were performed to evaluate the upper extremity deep venous system from the level of the subclavian vein and including the jugular, axillary, basilic and upper cephalic vein. Spectral Doppler was utilized to evaluate flow at rest and with distal augmentation maneuvers.  COMPARISON:  None.  FINDINGS: Thrombus within deep veins:  None visualized.  Compressibility of deep veins:  Normal.  Duplex waveform respiratory phasicity:  Normal.  Duplex waveform response to augmentation:  Normal.  Other findings:  None visualized.  IMPRESSION: No deep venous thrombosis in the visualized right upper extremity.   Electronically Signed   By: Julian Hy M.D.   On: 10/29/2013 14:07    ASSESSMENT:  Leukocytosis  PLAN #1 Leukocytosis The most likely cause of this is from chronic cigarette smoking/possible undiagnosed COPD I will order additional workup for this #2 mild anemia This is likely anemia of chronic disease. I will order additional blood smear  for evaluation #3 tobacco abuse I spent a lot of time educating the patient importance of nicotine cessation. She is interested to try the nicotine patch. I will also refer her to nicotine cessation class.

## 2013-11-06 NOTE — Telephone Encounter (Signed)
gv an printed apt sched and avs for pt fro Feb

## 2013-11-13 ENCOUNTER — Encounter: Payer: Self-pay | Admitting: Family Medicine

## 2013-11-27 ENCOUNTER — Ambulatory Visit (HOSPITAL_BASED_OUTPATIENT_CLINIC_OR_DEPARTMENT_OTHER): Payer: BC Managed Care – PPO | Admitting: Hematology and Oncology

## 2013-11-27 ENCOUNTER — Encounter: Payer: Self-pay | Admitting: Hematology and Oncology

## 2013-11-27 VITALS — BP 124/83 | HR 94 | Temp 96.8°F | Resp 19 | Ht 66.75 in | Wt 254.4 lb

## 2013-11-27 DIAGNOSIS — F17200 Nicotine dependence, unspecified, uncomplicated: Secondary | ICD-10-CM

## 2013-11-27 DIAGNOSIS — D72829 Elevated white blood cell count, unspecified: Secondary | ICD-10-CM

## 2013-11-27 MED ORDER — NICOTINE 14 MG/24HR TD PT24: 14.0000 mg | MEDICATED_PATCH | Freq: Every day | TRANSDERMAL | Status: AC

## 2013-11-27 NOTE — Progress Notes (Signed)
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 White count fluctuated from 14 to as high as 17.2. The patient has significant smoking history and COPD INTERVAL HISTORY: Theresa Castillo 48 y.o. female returns for further followup. She is attempting to quit smoking. She is currently down to 15 cigarettes a day. She is using the nicotine patch and is motivated to quit smoking.  I have reviewed the past medical history, past surgical history, social history and family history with the patient and they are unchanged from previous note.  ALLERGIES:  is allergic to ammonia; amoxicillin; artichoke; pineapple; topamax; benadryl; paxil; and latex.  MEDICATIONS:  Current Outpatient Prescriptions  Medication Sig Dispense Refill  . amphetamine-dextroamphetamine (ADDERALL) 20 MG tablet Take 1 tablet (20 mg total) by mouth daily with breakfast.  7 tablet  0  . aspirin EC 81 MG EC tablet Take 1 tablet (81 mg total) by mouth daily.      Marland Kitchen buPROPion (WELLBUTRIN XL) 150 MG 24 hr tablet Take 300 mg by mouth daily.      . folic acid (FOLVITE) 1 MG tablet Take 2 mg by mouth every morning.       Marland Kitchen levothyroxine (SYNTHROID, LEVOTHROID) 50 MCG tablet Take 50 mcg by mouth every morning.      . nicotine (NICODERM CQ - DOSED IN MG/24 HOURS) 21 mg/24hr patch Place 1 patch (21 mg total) onto the skin daily.  28 patch  0  . OLANZapine (ZYPREXA) 5 MG tablet Take 10 mg by mouth at bedtime.       . traZODone (DESYREL) 50 MG tablet Take 50 mg by mouth at bedtime.      Marland Kitchen venlafaxine XR (EFFEXOR-XR) 150 MG 24 hr capsule Take 300 mg by mouth daily with breakfast.       . nicotine (NICODERM CQ - DOSED IN MG/24 HOURS) 14 mg/24hr patch Place 1 patch (14 mg total) onto the skin daily.  14 patch  0   No current facility-administered  medications for this visit.     REVIEW OF SYSTEMS:   All other systems were reviewed with the patient and are negative.  PHYSICAL EXAMINATION: ECOG PERFORMANCE STATUS: 0 - Asymptomatic  Filed Vitals:   11/27/13 0959  BP: 124/83  Pulse: 94  Temp: 96.8 F (36 C)  Resp: 19   Filed Weights   11/27/13 0959  Weight: 254 lb 6.4 oz (115.395 kg)    GENERAL:alert, no distress and comfortable. She appeared morbidly obese NEURO: alert & oriented x 3 with fluent speech, no focal motor/sensory deficits  LABORATORY DATA:  I have reviewed the data as listed No results found for this or any previous visit (from the past 48 hour(s)).  Lab Results  Component Value Date   WBC 13.2* 11/06/2013   HGB 13.4 11/06/2013   HCT 40.1 11/06/2013   MCV 93.7 11/06/2013   PLT 275 11/06/2013   Peripheral smear were reviewed. White blood cell morphology is normal ASSESSMENT & PLAN:  #1 chronic leukocytosis This fluctuated up and down. I reassured the patient it does not fit into the picture to be worrisome for leukemia. I suspect once she quits smoking, her white blood cell count may be back to normal I recommend she follow with her PCP to have a CBC recheck once she is able to quit smoking. #2 tobacco dependency  I congratulated her effort of trying to quit smoking. She is down to three-quarter off a pack of cigarettes. I recommend we taper her nicotine patch to 14 mg and I prescribed her the prescription.  All questions were answered. The patient knows to call the clinic with any problems, questions or concerns. No barriers to learning was detected.  I spent 15 minutes counseling the patient face to face. The total time spent in the appointment was 20 minutes and more than 50% was on counseling.     [place], [place], MD 11/27/2013 10:14 AM

## 2013-12-07 ENCOUNTER — Encounter: Payer: Self-pay | Admitting: Family Medicine

## 2014-04-12 ENCOUNTER — Ambulatory Visit (INDEPENDENT_AMBULATORY_CARE_PROVIDER_SITE_OTHER): Payer: BLUE CROSS/BLUE SHIELD | Admitting: Obstetrics & Gynecology

## 2014-04-12 ENCOUNTER — Other Ambulatory Visit (HOSPITAL_COMMUNITY)
Admission: RE | Admit: 2014-04-12 | Discharge: 2014-04-12 | Disposition: A | Discharge status: Home / Self Care | Payer: BC Managed Care – PPO | Source: Ambulatory Visit | Attending: Obstetrics & Gynecology | Admitting: Obstetrics & Gynecology

## 2014-04-12 ENCOUNTER — Encounter: Payer: Self-pay | Admitting: Obstetrics & Gynecology

## 2014-04-12 VITALS — BP 110/80 | Ht 67.0 in | Wt 256.0 lb

## 2014-04-12 DIAGNOSIS — Z1151 Encounter for screening for human papillomavirus (HPV): Secondary | ICD-10-CM | POA: Insufficient documentation

## 2014-04-12 DIAGNOSIS — Z01419 Encounter for gynecological examination (general) (routine) without abnormal findings: Secondary | ICD-10-CM | POA: Insufficient documentation

## 2014-04-12 DIAGNOSIS — Z1212 Encounter for screening for malignant neoplasm of rectum: Secondary | ICD-10-CM

## 2014-04-12 NOTE — Patient Instructions (Signed)
Mammogram [phone]

## 2014-04-12 NOTE — Progress Notes (Signed)
Patient ID: Theresa Castillo, female   DOB: [birth year], 48 y.o.   MRN: [id number] Subjective:     Theresa Castillo is a 48 y.o. female here for a routine exam.  No LMP recorded. Patient is postmenopausal. No obstetric history on file. Birth Control Method:  na Menstrual Calendar(currently): amenorrheic x 2 years  Current complaints: none.   Current acute medical issues:  No acute   Recent Gynecologic History No LMP recorded. Patient is postmenopausal. 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 Allergy     pineapple abd pain and diarrhea  . Anxiety   . Depression   . Thyroid disease     borderline hypo  . Ulcer     born with stomach ulcer  . Hyperlipidemia   . 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 Chronic pain of right wrist   . Abnormal Pap smear   . Sleep apnea   . Tobacco dependency 11/06/2013  . TIA (transient ischemic attack)     Past Surgical History  Procedure Laterality Date  . Cholecystectomy  1995  . Salpingoophorectomy  1984    right  . Ankle surgery  2003    right  . Pilonial cyst removed twice    . Tubal ligation    . Oophorectomy    . Uterine fibroid surgery    . Breast biopsy  08/22/2011    Procedure: BREAST BIOPSY WITH NEEDLE LOCALIZATION;  Surgeon: Adin Hector, MD;  Location: [facility];  Service: General;  Laterality: Left;  Left  BREAST DUCT EXCISION & PARATIAL MASTECTOMY W/NEEDLE LOCALIZATION  . Gum surgery      OB History   Grav Para Term Preterm Abortions TAB SAB Ect Mult Living                  History   Social History  . Marital Status: Married    Spouse Name: N/A    Number of Children: N/A  . Years of Education: N/A   Social History Main Topics  . Smoking status: Current Every Day Smoker -- 1.00 packs/day for 30 years    Types: Cigarettes  . Smokeless tobacco: Never Used     Comment: never done snuff or  chewing tobacco.  . Alcohol Use: Yes     Comment: rarely  . Drug Use: No  . Sexual Activity: Yes    Birth Control/ Protection: Post-menopausal   Other Topics Concern  . None   Social History Narrative  . None    Family History  Problem Relation Age of Onset  . Colon cancer Father   . Heart disease Father   . Heart attack Father   . Cancer Mother     breast/lukemia  . Cancer Maternal Aunt     breast and thyroid  . Cancer Paternal Aunt     breast  . Cancer Maternal Grandmother     colon     Review of Systems  Review of Systems  Constitutional: Negative for fever, chills, weight loss, malaise/fatigue and diaphoresis.  HENT: Negative for hearing loss, ear pain, nosebleeds, congestion, sore throat, neck pain, tinnitus and ear discharge.   Eyes: Negative for blurred vision, double vision, photophobia, pain, discharge and redness.  Respiratory: Negative for cough, hemoptysis, sputum production, shortness of breath, wheezing and stridor.   Cardiovascular: Negative for chest pain,  palpitations, orthopnea, claudication, leg swelling and PND.  Gastrointestinal: negative for abdominal pain. Negative for heartburn, nausea, vomiting, diarrhea, constipation, blood in stool and melena.  Genitourinary: Negative for dysuria, urgency, frequency, hematuria and flank pain.  Musculoskeletal: Negative for myalgias, back pain, joint pain and falls.  Skin: Negative for itching and rash.  Neurological: Negative for dizziness, tingling, tremors, sensory change, speech change, focal weakness, seizures, loss of consciousness, weakness and headaches.  Endo/Heme/Allergies: Negative for environmental allergies and polydipsia. Does not bruise/bleed easily.  Psychiatric/Behavioral: Negative for depression, suicidal ideas, hallucinations, memory loss and substance abuse. The patient is not nervous/anxious and does not have insomnia.        Objective:    Physical Exam  Vitals  reviewed. Constitutional: She is oriented to person, place, and time. She appears well-developed and well-nourished.  HENT:  Head: Normocephalic and atraumatic.        Right Ear: External ear normal.  Left Ear: External ear normal.  Nose: Nose normal.  Mouth/Throat: Oropharynx is clear and moist.  Eyes: Conjunctivae and EOM are normal. Pupils are equal, round, and reactive to light. Right eye exhibits no discharge. Left eye exhibits no discharge. No scleral icterus.  Neck: Normal range of motion. Neck supple. No tracheal deviation present. No thyromegaly present.  Cardiovascular: Normal rate, regular rhythm, normal heart sounds and intact distal pulses.  Exam reveals no gallop and no friction rub.   No murmur heard. Respiratory: Effort normal and breath sounds normal. No respiratory distress. She has no wheezes. She has no rales. She exhibits no tenderness.  GI: Soft. Bowel sounds are normal. She exhibits no distension and no mass. There is no tenderness. There is no rebound and no guarding.  Genitourinary:  Breasts no masses skin changes or nipple changes bilaterally      Vulva is normal without lesions Vagina is pink moist without discharge Cervix normal in appearance and pap is done Uterus is normal size shape and contour Adnexa is negative with normal sized ovaries  Rectal    hemoccult negative, normal tone, no masses  Musculoskeletal: Normal range of motion. She exhibits no edema and no tenderness.  Neurological: She is alert and oriented to person, place, and time. She has normal reflexes. She displays normal reflexes. No cranial nerve deficit. She exhibits normal muscle tone. Coordination normal.  Skin: Skin is warm and dry. No rash noted. No erythema. No pallor.  Psychiatric: She has a normal mood and affect. Her behavior is normal. Judgment and thought content normal.       Assessment:    Healthy female exam.    Plan:    Mammogram ordered. Follow up in: 1 year.

## 2014-04-13 LAB — CYTOLOGY - PAP

## 2014-04-28 ENCOUNTER — Telehealth: Payer: Self-pay | Admitting: Obstetrics & Gynecology

## 2014-04-28 ENCOUNTER — Telehealth: Payer: Self-pay | Admitting: Adult Health

## 2014-04-28 NOTE — Telephone Encounter (Signed)
Left message x 1. JSY 

## 2014-04-28 NOTE — Telephone Encounter (Signed)
Spoke with pt. Encounter closed. JSY °

## 2014-04-28 NOTE — Telephone Encounter (Signed)
Spoke with pt letting her know pap was normal. Pt asked if it showed HPV and I advised HPV was not checked. HPV was able to be added to pap. Pt aware. Advised pt to call us if she hasn't heard from [place] by Tuesday. Pt voiced understanding. [place]

## 2014-07-14 ENCOUNTER — Other Ambulatory Visit: Payer: BLUE CROSS/BLUE SHIELD

## 2014-10-06 ENCOUNTER — Ambulatory Visit (INDEPENDENT_AMBULATORY_CARE_PROVIDER_SITE_OTHER): Payer: BLUE CROSS/BLUE SHIELD | Admitting: Family Medicine

## 2014-10-06 ENCOUNTER — Encounter: Payer: Self-pay | Admitting: Family Medicine

## 2014-10-06 VITALS — BP 134/76 | HR 78 | Temp 98.2°F | Resp 16 | Ht 67.0 in | Wt 254.0 lb

## 2014-10-06 DIAGNOSIS — J069 Acute upper respiratory infection, unspecified: Secondary | ICD-10-CM

## 2014-10-06 DIAGNOSIS — J0100 Acute maxillary sinusitis, unspecified: Secondary | ICD-10-CM

## 2014-10-06 MED ORDER — CEFDINIR 300 MG PO CAPS: 300.0000 mg | ORAL_CAPSULE | Freq: Two times a day (BID) | ORAL | Status: DC

## 2014-10-06 NOTE — Progress Notes (Signed)
Patient ID: Theresa Castillo, female   DOB: 10-20-1965, 48 y.o.   MRN: [id number]   Subjective:    Patient ID: Theresa Castillo, female    DOB: 26-Apr-1966, 48 y.o.   MRN: [id number]  Patient presents for Illness  patient here with persistent illness. She was seen at urgent care about a week ago secondary to ear pain and some congestion she was given a Z-Pak however she states that it did not improve her symptoms and that it interfered with her psychiatric medications they get her more depressed. She now has bilateral ear pain as well as a lot of sinus pressure and drainage and some mild cough that started yesterday. She is a smoker. She had a low-grade fever last night as well. She's not had any wheezing or shortness of breath. She has been taking some over-the-counter medications.    Review Of Systems:  GEN- denies fatigue, fever, weight loss,weakness, recent illness HEENT- denies eye drainage, change in vision, +nasal discharge, CVS- denies chest pain, palpitations RESP- denies SOB,+ cough, wheeze ABD- denies N/V, change in stools, abd pain Neuro- denies headache, dizziness, syncope, seizure activity       Objective:    BP 134/76 mmHg  Pulse 78  Temp(Src) 98.2 F (36.8 C) (Oral)  Resp 16  Ht 5\' 7"  (1.702 m)  Wt 254 lb (115.214 kg)  BMI 39.77 kg/m2 GEN- NAD, alert and oriented x3 HEENT- PERRL, EOMI, non injected sclera, pink conjunctiva, MMM, oropharynx mild injection, TM clear bilat no effusion, mild  maxillary sinus tenderness, inflammed turbinates,  Nasal drainage  Neck- Supple, shotty  LAD CVS- RRR, no murmur RESP-CTAB Pulses- Radial 2+          Assessment & Plan:      Problem List Items Addressed This Visit    None    Visit Diagnoses    Acute maxillary sinusitis, recurrence not specified    -  Primary    Treat with omnicef, mucinex DM, declines nasal preparations, fluids, given work note for today and tomorrow due to fever    Relevant Medications     loratadine (CLARITIN) 10 MG tablet       cefdinir (OMNICEF) capsule 300 mg    Acute URI        Relevant Medications       cefdinir (OMNICEF) capsule 300 mg       Note: This dictation was prepared with Dragon dictation along with smaller phrase technology. Any transcriptional errors that result from this process are unintentional.

## 2014-10-06 NOTE — Patient Instructions (Signed)
Mucinex DM- mucous and cough Take antibiotics  Tylenol or ibuprofen fever Plenty of fluids F/U as needed

## 2015-01-31 ENCOUNTER — Ambulatory Visit (INDEPENDENT_AMBULATORY_CARE_PROVIDER_SITE_OTHER): Payer: 59 | Admitting: Physician Assistant

## 2015-01-31 ENCOUNTER — Encounter: Payer: Self-pay | Admitting: Physician Assistant

## 2015-01-31 VITALS — BP 114/76 | HR 72 | Temp 98.6°F | Resp 18 | Wt 257.0 lb

## 2015-01-31 DIAGNOSIS — E039 Hypothyroidism, unspecified: Secondary | ICD-10-CM

## 2015-01-31 DIAGNOSIS — H9202 Otalgia, left ear: Secondary | ICD-10-CM

## 2015-01-31 DIAGNOSIS — B078 Other viral warts: Secondary | ICD-10-CM

## 2015-01-31 DIAGNOSIS — B079 Viral wart, unspecified: Secondary | ICD-10-CM

## 2015-01-31 LAB — TSH: TSH: 2.694 u[IU]/mL (ref 0.350–4.500)

## 2015-01-31 MED ORDER — TRAMADOL HCL 50 MG PO TABS: ORAL_TABLET | ORAL | Status: DC

## 2015-01-31 NOTE — Progress Notes (Signed)
Patient ID: Theresa Castillo MRN: [id number], DOB: 25-Oct-1965, 49 y.o. Date of Encounter: @DATE @  Chief Complaint:  Chief Complaint  Patient presents with  . left earache    x 5 months    HPI: 49 y.o. year old female  presents with above complaint.   Says that she went to an U/C in December--was treated with antibiotics.  Subsequently, had OV with Dr. Buelah Manis 10/06/2014--treated with antibiotics.  Still has pain left ear. Pain originates in ear but then causes her to clench her teeth.  No mucus from nose. No ST. No cough.  No fever, chills. Says it feels different than ear ache, sore throat kind of ear ache.   Does not originate at TM joint.  Not c/w trigeminal neuralgia.  Also, has wart on thumb.   Als, says NP at Psych that was prescribing thyroid med no longer there.  NP she sees at psych now told pt she does not manage thyroid.  Therefore, pt needing [place] to manage thyroid. Says that lab work is due.    Past Medical History  Diagnosis Date  . Allergy     pineapple abd pain and diarrhea  . Anxiety   . Depression   . Thyroid disease     borderline hypo  . Ulcer     born with stomach ulcer  . Hyperlipidemia   . 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 Chronic pain of right wrist   . Abnormal Pap smear   . Sleep apnea   . Tobacco dependency 11/06/2013  . TIA (transient ischemic attack)      Home Meds: Outpatient Prescriptions Prior to Visit  Medication Sig Dispense Refill  . amphetamine-dextroamphetamine (ADDERALL) 20 MG tablet Take 1 tablet (20 mg total) by mouth daily with breakfast. 7 tablet 0  . aspirin EC 81 MG EC tablet Take 1 tablet (81 mg total) by mouth daily.    Marland Kitchen buPROPion (WELLBUTRIN XL) 150 MG 24 hr tablet Take 300 mg by mouth daily.    . folic acid (FOLVITE) 1 MG tablet Take 2 mg by mouth every morning.     Marland Kitchen levothyroxine (SYNTHROID, LEVOTHROID) 50 MCG tablet Take 50 mcg by mouth every morning.    . loratadine (CLARITIN) 10 MG tablet Take 10 mg  by mouth daily.    . Lurasidone HCl 60 MG TABS Take 1 tablet by mouth daily.    . traZODone (DESYREL) 50 MG tablet Take 50 mg by mouth at bedtime.    . nicotine (NICODERM CQ - DOSED IN MG/24 HOURS) 14 mg/24hr patch Place 1 patch (14 mg total) onto the skin daily. (Patient not taking: Reported on 01/31/2015) 14 patch 0  . nicotine (NICODERM CQ - DOSED IN MG/24 HOURS) 21 mg/24hr patch Place 1 patch (21 mg total) onto the skin daily. (Patient not taking: Reported on 01/31/2015) 28 patch 0  . venlafaxine XR (EFFEXOR-XR) 150 MG 24 hr capsule Take 300 mg by mouth daily with breakfast.     . cefdinir (OMNICEF) 300 MG capsule Take 1 capsule (300 mg total) by mouth 2 (two) times daily. 14 capsule 0   No facility-administered medications prior to visit.    Allergies:  Allergies  Allergen Reactions  . Ammonia Other (See Comments)    Severe migraines and pass out  . Ammonium-Containing Compounds Anaphylaxis  . Amoxicillin Other (See Comments)    syncope  . Artichoke [Cynara Scolymus (Artichoke)]     Tongue swells up  .  Pineapple Nausea And Vomiting and Swelling    Swelling tongue  . Topamax Other (See Comments)    Caused neurological deficits, confusion, bad intentions   . Penicillins Rash  . Benadryl [Diphenhydramine Hcl (Sleep)]     *can take childrens dose, but when takes adult dose, patient cannot remember anything for 3 days*  . Paxil [Paroxetine Hcl]     *started seeing double*  . Latex Rash and Other (See Comments)    Cracking of skin    History   Social History  . Marital Status: Married    Spouse Name: N/A  . Number of Children: N/A  . Years of Education: N/A   Occupational History  . Not on file.   Social History Main Topics  . Smoking status: Current Every Day Smoker -- 1.00 packs/day for 30 years    Types: Cigarettes  . Smokeless tobacco: Never Used     Comment: never done snuff or chewing tobacco.  . Alcohol Use: Yes     Comment: rarely  . Drug Use: No  . Sexual  Activity: Yes    Birth Control/ Protection: Post-menopausal   Other Topics Concern  . Not on file   Social History Narrative    Family History  Problem Relation Age of Onset  . Colon cancer Father   . Heart disease Father   . Heart attack Father   . Cancer Mother     breast/lukemia  . Cancer Maternal Aunt     breast and thyroid  . Cancer Paternal Aunt     breast  . Cancer Maternal Grandmother     colon     Review of Systems:  See HPI for pertinent ROS. All other ROS negative.    Physical Exam: Blood pressure 114/76, pulse 72, temperature 98.6 F (37 C), temperature source Oral, resp. rate 18, weight 257 lb (116.574 kg)., Body mass index is 40.24 kg/(m^2). General: Overweight WF. Appears in no acute distress. Head: Normocephalic, atraumatic, eyes without discharge, sclera non-icteric, nares are without discharge. Bilateral auditory canals clear, TM's are without perforation, pearly grey and translucent with reflective cone of light bilaterally. Oral cavity moist, posterior pharynx without exudate, erythema, peritonsillar abscess. Exam of left ear appears normal. No tenderness with palpation of left TM joint. No tenderness with touch of left cheek area to suggest trigeminal neuralgia.   Neck: Supple. No thyromegaly. No lymphadenopathy. Lungs: Clear bilaterally to auscultation without wheezes, rales, or rhonchi. Breathing is unlabored. Heart: RRR with S1 S2. No murmurs, rubs, or gallops. Musculoskeletal:  Strength and tone normal for age. Extremities/Skin: Right Thumb: 2 warts--one is approx 3 mm one is approx 49mm.  Neuro: Alert and oriented X 3. Moves all extremities spontaneously. Gait is normal. CNII-XII grossly in tact. Psych:  Responds to questions appropriately with a normal affect.     ASSESSMENT AND PLAN:  49 y.o. year old female with  1. Otalgia of left ear She is currnelntly taking large amount of Aleve. Will use tramadol.  Will refer to ENT.  - Ambulatory  referral to ENT - traMADol (ULTRAM) 50 MG tablet; Take 1 -2 every 8 hours as needed for pain  Dispense: 60 tablet; Refill: 0  2. Hypothyroidism, unspecified hypothyroidism type - TSH  3. Verruca Cryotherapy applied for 3 freeze-thaw cycles.  RTC in 2 weeks for repeat cryotherapy.    Signed, [address] [place], [place], [facility] 01/31/2015 9:02 AM

## 2015-02-01 ENCOUNTER — Telehealth: Payer: Self-pay | Admitting: Family Medicine

## 2015-02-01 MED ORDER — LEVOTHYROXINE SODIUM 50 MCG PO TABS: 50.0000 ug | ORAL_TABLET | Freq: Every day | ORAL | Status: AC

## 2015-02-01 NOTE — Telephone Encounter (Signed)
-----   Message from Orlena Sheldon, PA-C sent at 02/01/2015 10:53 AM EDT ----- Lab normal. Continue current dose of thyroid med. Recheck with lab and OV in 6 months.

## 2015-02-01 NOTE — Telephone Encounter (Signed)
Pt aware of lab results and provider recommendations.  Refill for 6 months sent

## 2015-02-08 ENCOUNTER — Encounter: Payer: Self-pay | Admitting: Physician Assistant

## 2015-02-09 ENCOUNTER — Telehealth: Payer: Self-pay | Admitting: Physician Assistant

## 2015-02-09 MED ORDER — MELOXICAM 7.5 MG PO TABS: 7.5000 mg | ORAL_TABLET | Freq: Every day | ORAL | Status: DC

## 2015-02-09 NOTE — Telephone Encounter (Signed)
Pt sent me an email stating that ENT said that her pain was coming from her jaw. Also she cannot take tramadol given her psych medications. On my email back to her, I discussed other treatments regarding her jaw pain. I also told her on the email that I would send in a medication Mobic for her to take. I will send this prescription now.

## 2015-02-14 ENCOUNTER — Encounter: Payer: Self-pay | Admitting: Physician Assistant

## 2015-02-14 ENCOUNTER — Ambulatory Visit (INDEPENDENT_AMBULATORY_CARE_PROVIDER_SITE_OTHER): Payer: 59 | Admitting: Physician Assistant

## 2015-02-14 VITALS — BP 110/76 | HR 84 | Temp 98.0°F | Resp 18 | Wt 258.0 lb

## 2015-02-14 DIAGNOSIS — M2662 Arthralgia of temporomandibular joint: Secondary | ICD-10-CM | POA: Diagnosis not present

## 2015-02-14 DIAGNOSIS — B079 Viral wart, unspecified: Secondary | ICD-10-CM | POA: Diagnosis not present

## 2015-02-14 DIAGNOSIS — F17200 Nicotine dependence, unspecified, uncomplicated: Secondary | ICD-10-CM | POA: Diagnosis not present

## 2015-02-14 DIAGNOSIS — M26629 Arthralgia of temporomandibular joint, unspecified side: Secondary | ICD-10-CM

## 2015-02-14 DIAGNOSIS — B078 Other viral warts: Secondary | ICD-10-CM

## 2015-02-14 MED ORDER — NICOTINE 21 MG/24HR TD PT24: 21.0000 mg | MEDICATED_PATCH | Freq: Every day | TRANSDERMAL | Status: AC

## 2015-02-14 MED ORDER — MELOXICAM 7.5 MG PO TABS: 7.5000 mg | ORAL_TABLET | Freq: Two times a day (BID) | ORAL | Status: AC

## 2015-02-14 NOTE — Progress Notes (Signed)
Patient ID: Theresa Castillo MRN: [id number], DOB: 1966-10-04, 49 y.o. Date of Encounter: @DATE @  Chief Complaint:  Chief Complaint  Patient presents with  . 2 week follow up    says feeling better    HPI: 49 y.o. year old female  presents follow-up office visit.  THE FOLLOWING IS COPIED FROM HER OV NOTE 01/31/2015:  Says that she went to an U/C in December--was treated with antibiotics.  Subsequently, had OV with Dr. Buelah Manis 10/06/2014--treated with antibiotics.  Still has pain left ear. Pain originates in ear but then causes her to clench her teeth.  No mucus from nose. No ST. No cough.  No fever, chills. Says it feels different than ear ache, sore throat kind of ear ache.   Does not originate at TM joint.  Not c/w trigeminal neuralgia.  Also, has wart on thumb.   Als, says NP at Psych that was prescribing thyroid med no longer there.  NP she sees at psych now told pt she does not manage thyroid.  Therefore, pt needing [place] to manage thyroid. Says that lab work is due.    1. Otalgia of left ear She is currnelntly taking large amount of Aleve. Will use tramadol.  Will refer to ENT.  - Ambulatory referral to ENT - traMADol (ULTRAM) 50 MG tablet; Take 1 -2 every 8 hours as needed for pain  Dispense: 60 tablet; Refill: 0  2. Hypothyroidism, unspecified hypothyroidism type - TSH  3. Verruca Cryotherapy applied for 3 freeze-thaw cycles.  RTC in 2 weeks for repeat cryotherapy.   Pt sent me a email recently letting me know that ENT told her that the pain she was feeling was not coming from the ear but rather from the TM joint. I subsequently prescribed Mobic. She states that this is helping a lot and her pain is much better but not completely resolved. She states that she does clench her teeth and grind her teeth at night.  As well she needs repeat treatment to her wart.  Also she is requesting nicotine patches. States that she currently is smoking close to 2 packs  per day.    Past Medical History  Diagnosis Date  . Allergy     pineapple abd pain and diarrhea  . Anxiety   . Depression   . Thyroid disease     borderline hypo  . Ulcer     born with stomach ulcer  . Hyperlipidemia   . 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 Chronic pain of right wrist   . Abnormal Pap smear   . Sleep apnea   . Tobacco dependency 11/06/2013  . TIA (transient ischemic attack)      Home Meds: Outpatient Prescriptions Prior to Visit  Medication Sig Dispense Refill  . amphetamine-dextroamphetamine (ADDERALL) 20 MG tablet Take 1 tablet (20 mg total) by mouth daily with breakfast. 7 tablet 0  . aspirin EC 81 MG EC tablet Take 1 tablet (81 mg total) by mouth daily.    Marland Kitchen buPROPion (WELLBUTRIN XL) 150 MG 24 hr tablet Take 300 mg by mouth daily.    . folic acid (FOLVITE) 1 MG tablet Take 2 mg by mouth every morning.     . Levomilnacipran HCl (FETZIMA PO) Take by mouth. Takes 80 mg in the AM, 40 mg at night    . levothyroxine (SYNTHROID, LEVOTHROID) 50 MCG tablet Take 1 tablet (50 mcg total) by mouth daily before breakfast. 90 tablet 1  .  loratadine (CLARITIN) 10 MG tablet Take 10 mg by mouth daily.    . Lurasidone HCl 60 MG TABS Take 1 tablet by mouth daily.    . Omega-3 Fatty Acids (FISH OIL) 500 MG CAPS Take by mouth.    . traZODone (DESYREL) 50 MG tablet Take 50 mg by mouth at bedtime.    Marland Kitchen venlafaxine XR (EFFEXOR-XR) 150 MG 24 hr capsule Take 300 mg by mouth daily with breakfast.     . meloxicam (MOBIC) 7.5 MG tablet Take 1 tablet (7.5 mg total) by mouth daily. 30 tablet 2  . nicotine (NICODERM CQ - DOSED IN MG/24 HOURS) 14 mg/24hr patch Place 1 patch (14 mg total) onto the skin daily. (Patient not taking: Reported on 01/31/2015) 14 patch 0  . nicotine (NICODERM CQ - DOSED IN MG/24 HOURS) 21 mg/24hr patch Place 1 patch (21 mg total) onto the skin daily. (Patient not taking: Reported on 01/31/2015) 28 patch 0  . traMADol (ULTRAM) 50 MG tablet Take 1 -2 every 8 hours as  needed for pain (Patient not taking: Reported on 02/14/2015) 60 tablet 0   No facility-administered medications prior to visit.    Allergies:  Allergies  Allergen Reactions  . Ammonia Other (See Comments)    Severe migraines and pass out  . Ammonium-Containing Compounds Anaphylaxis  . Amoxicillin Other (See Comments)    syncope  . Artichoke [Cynara Scolymus (Artichoke)]     Tongue swells up  . Pineapple Nausea And Vomiting and Swelling    Swelling tongue  . Topamax Other (See Comments)    Caused neurological deficits, confusion, bad intentions   . Penicillins Rash  . Benadryl [Diphenhydramine Hcl (Sleep)]     *can take childrens dose, but when takes adult dose, patient cannot remember anything for 3 days*  . Paxil [Paroxetine Hcl]     *started seeing double*  . Latex Rash and Other (See Comments)    Cracking of skin    History   Social History  . Marital Status: Married    Spouse Name: N/A  . Number of Children: N/A  . Years of Education: N/A   Occupational History  . Not on file.   Social History Main Topics  . Smoking status: Current Every Day Smoker -- 1.00 packs/day for 30 years    Types: Cigarettes  . Smokeless tobacco: Never Used     Comment: never done snuff or chewing tobacco.  . Alcohol Use: Yes     Comment: rarely  . Drug Use: No  . Sexual Activity: Yes    Birth Control/ Protection: Post-menopausal   Other Topics Concern  . Not on file   Social History Narrative    Family History  Problem Relation Age of Onset  . Colon cancer Father   . Heart disease Father   . Heart attack Father   . Cancer Mother     breast/lukemia  . Cancer Maternal Aunt     breast and thyroid  . Cancer Paternal Aunt     breast  . Cancer Maternal Grandmother     colon     Review of Systems:  See HPI for pertinent ROS. All other ROS negative.    Physical Exam: Blood pressure 110/76, pulse 84, temperature 98 F (36.7 C), temperature source Oral, resp. rate 18,  weight 258 lb (117.028 kg)., Body mass index is 40.4 kg/(m^2). General: Overweight WF. Appears in no acute distress. Neck: Supple. No thyromegaly. No lymphadenopathy. Lungs: Clear bilaterally to auscultation without wheezes,  rales, or rhonchi. Breathing is unlabored. Heart: RRR with S1 S2. No murmurs, rubs, or gallops. Musculoskeletal:  Strength and tone normal for age. Extremities/Skin: Right Thumb: 2 warts--one is approx 3 mm one is approx 51mm.  Neuro: Alert and oriented X 3. Moves all extremities spontaneously. Gait is normal. CNII-XII grossly in tact. Psych:  Responds to questions appropriately with a normal affect.     ASSESSMENT AND PLAN:  49 y.o. year old female with    1. TMJ arthralgia I told her that she can take the Motrin up to 2 times per day if needed. To take it with food. Discussed  that she may need to see her dentist about getting him mouth guard. Says that she will need to get dental insurance first before she can afford to follow up with this. - meloxicam (MOBIC) 7.5 MG tablet; Take 1 tablet (7.5 mg total) by mouth 2 (two) times daily. As needed for pain  Dispense: 60 tablet; Refill: 2  2. Verrucae vulgaris Cryotherapy applied to both sites for 4 freeze thaw cycles to each site.  3. Tobacco dependency - nicotine (NICODERM CQ - DOSED IN MG/24 HOURS) 21 mg/24hr patch; Place 1 patch (21 mg total) onto the skin daily.  Dispense: 28 patch; Refill: 0 Discussed with her that she must stop/decreased significantly her amount of smoking while she is using the patch or else she is getting even more nicotine than she is at present. Told her when this month of patches runs out to call us and we will send in prescription for the 14 mg dose and will gradually taper down the dose.   4. Hypothyroidism, unspecified hypothyroidism type - TSH was obtained at last office visit 01/31/15 and we have followed up with that result.  I told her to wait 2-3 weeks and then reexamine her thumb  and see whether repeat cryotherapy is can be necessary or not.   [address] [place], [place], [facility] 02/14/2015 12:40 PM

## 2015-08-10 ENCOUNTER — Ambulatory Visit (INDEPENDENT_AMBULATORY_CARE_PROVIDER_SITE_OTHER): Payer: Worker's Compensation | Admitting: Family Medicine

## 2015-08-10 ENCOUNTER — Ambulatory Visit: Payer: Worker's Compensation

## 2015-08-10 VITALS — BP 124/78 | HR 84 | Temp 98.1°F | Resp 18 | Ht 67.0 in | Wt 249.0 lb

## 2015-08-10 DIAGNOSIS — M79641 Pain in right hand: Secondary | ICD-10-CM | POA: Diagnosis not present

## 2015-08-10 DIAGNOSIS — S6721XA Crushing injury of right hand, initial encounter: Secondary | ICD-10-CM

## 2015-08-10 DIAGNOSIS — Z23 Encounter for immunization: Secondary | ICD-10-CM | POA: Diagnosis not present

## 2015-08-10 MED ORDER — MELOXICAM 7.5 MG PO TABS: 7.5000 mg | ORAL_TABLET | Freq: Every day | ORAL | Status: AC

## 2015-08-10 NOTE — Patient Instructions (Signed)
Wear the splint well it worked, meloxicam 7.5 mg daily for inflammation and swelling, recheck in 5 days

## 2015-08-10 NOTE — Progress Notes (Addendum)
° °  This chart was scribed for Theresa Haber, MD by Moises Blood, medical scribe at [facility].The patient was seen in exam room 5 and the patient's care was started at 10:55 AM.  Patient ID: Theresa Castillo MRN: [id number], DOB: 1965-10-25, 49 y.o. Date of Encounter: 08/10/2015  Primary Physician: No PCP Per Patient  Chief Complaint:  Chief Complaint  Patient presents with   Hand Injury    Rt., last night, incident with heavy meat slicer    HPI:  Theresa Castillo is a 49 y.o. female who presents to [facility] complaining of right hand injury with workers comp that occurred yesterday. She works in [employer], and had to clean under the cutter. While she was cleaning, she felt the machine tilt towards her, and she tried to readjust herself, but the machine fell on her hand. She noted that her hand was purple and more swollen yesterday. She has a small cut on the top of her skin. She denies the blades cutting her. She notes pain going into her shoulders.   Work recommends tetanus shot because skin did break over dorsal hand  She works at [employer].   Allergies:  Allergies  Allergen Reactions   Amoxicillin Other (See Comments)    Fainting    Benadryl [Diphenhydramine] Swelling   Pineapple Swelling     Review of Systems: Constitutional: negative for chills, fever, night sweats, weight changes, or fatigue  HEENT: negative for vision changes, hearing loss, congestion, rhinorrhea, ST, epistaxis, or sinus pressure Cardiovascular: negative for chest pain or palpitations Respiratory: negative for hemoptysis, wheezing, shortness of breath, or cough Abdominal: negative for abdominal pain, nausea, vomiting, diarrhea, or constipation Dermatological: negative for rash Neurologic: negative for headache, dizziness, or syncope Musc: positive for myalgia (right hand), arthralgia (right hand)  All other systems reviewed and are otherwise negative  with the exception to those above and in the HPI.  Physical Exam: Blood pressure 124/78, pulse 84, temperature 98.1 F (36.7 C), temperature source Oral, resp. rate 18, height 5\' 7"  (1.702 m), weight 249 lb (112.946 kg), SpO2 97 %., Body mass index is 38.99 kg/(m^2). General: Well developed, well nourished, in no acute distress. Head: Normocephalic, atraumatic, eyes without discharge, sclera non-icteric, nares are without discharge. Bilateral auditory canals clear, TM's are without perforation, pearly grey and translucent with reflective cone of light bilaterally. Oral cavity moist, posterior pharynx without exudate, erythema, peritonsillar abscess, or post nasal drip.  Neck: Supple. No thyromegaly. Full ROM. No lymphadenopathy. Msk:  Strength and tone normal for age. Extremities/Skin: Warm and dry. Right hand mild swelling and mildly ecchymotic over the dorsal.  Barely noticeable right dorsal hand laceration measuring 5-6 mm long when skin retracted, partial-thickness Neuro: Alert and oriented X 3. Moves all extremities spontaneously. Gait is normal. CNII-XII grossly in tact. Psych:  Responds to questions appropriately with a normal affect.  [facility] reading (PRIMARY) by  Dr. Joseph Art:  Right hand --> no fractures seen  ASSESSMENT AND PLAN:  49 y.o. year old female with crush injury to hand  Recheck 5 days meloxicam Weight restriction for work Thumb spica splint   By signing my name below, I, Moises Blood, attest that this documentation has been prepared under the direction and in the presence of Theresa Haber, MD. Electronically Signed: Moises Blood, Scribe. 08/10/2015 , 10:55 AM .  Signed, Theresa Haber, MD 08/10/2015 10:55 AM

## 2015-08-12 ENCOUNTER — Encounter: Payer: Self-pay | Admitting: Physician Assistant

## 2015-08-15 ENCOUNTER — Ambulatory Visit (INDEPENDENT_AMBULATORY_CARE_PROVIDER_SITE_OTHER): Payer: Worker's Compensation | Admitting: Family Medicine

## 2015-08-15 VITALS — BP 118/70 | HR 96 | Temp 98.3°F | Resp 16 | Ht 67.0 in | Wt 251.0 lb

## 2015-08-15 DIAGNOSIS — S60221D Contusion of right hand, subsequent encounter: Secondary | ICD-10-CM | POA: Diagnosis not present

## 2015-08-15 NOTE — Progress Notes (Signed)
Theresa Castillo November 09, 1965 49 y.o.   Chief Complaint  Patient presents with  . Follow-up    workers comp. hand injury right hand     Date of Injury: 08/09/15  History of Present Illness:  Presents for evaluation of work-related complaint. Sh is here today to follow-up a WC accident that occurred when a meat cutter started to tip over and hit her right hand while at work. She was seen for this 5 days ago, placed in a wrist splint and put on light duty She had a small skin laceration at her last visit- now healed Overall she is a bit better, but has noted a throbbing in her 4th and 5th fingers that will go up into her shoulder Sh also feels that her 4th and 5th fingers are a bit tingly  She is right handed.    She is using mobic as needed  OW unhurt. No other concerns today  ROS    Allergies  Allergen Reactions  . Ammonia Other (See Comments)    Severe migraines and pass out  . Ammonium-Containing Compounds Anaphylaxis  . Amoxicillin Other (See Comments)    syncope  . Artichoke [Cynara Scolymus (Artichoke)]     Tongue swells up  . Pineapple Nausea And Vomiting and Swelling    Swelling tongue  . Topamax Other (See Comments)    Caused neurological deficits, confusion, bad intentions   . Penicillins Rash  . Amoxicillin Other (See Comments)    Fainting   . Benadryl [Diphenhydramine Hcl (Sleep)]     *can take childrens dose, but when takes adult dose, patient cannot remember anything for 3 days*  . Benadryl [Diphenhydramine] Swelling  . Paxil [Paroxetine Hcl]     *started seeing double*  . Pineapple Swelling  . Latex Rash and Other (See Comments)    Cracking of skin     Current medications reviewed and updated. Past medical history, family history, social history have been reviewed and updated.   Physical Exam  Filed Vitals:   08/15/15 1059  BP: 118/70  Pulse: 96  Temp: 98.3 F (36.8 C)  Resp: 16   GEN: WDWN, NAD, Non-toxic, A & O x 3, obese, looks  well HEENT: Atraumatic, Normocephalic. Neck supple. No masses, No LAD. Ears and Nose: No external deformity. CV: RRR, No M/G/R. No JVD. No thrill. No extra heart sounds. PULM: CTA B, no wheezes, crackles, rhonchi. No retractions. No resp. distress. No accessory muscle use. EXTR: No c/c/e NEURO Normal gait.  PSYCH: Normally interactive. Conversant. Not depressed or anxious appearing.  Calm demeanor.  Right hand: wearing a spica wrist splint.  She has normal grip strength and movement of the hand.  She is able to perceive  filament and light touch to all fingers, but this is reduced in the 4th and 5th.  Normal cap refill There is bruising and tenderness over the dorsum of the hand. Most at the 2nd and 3rd MC Assessment and Plan: Hand contusion, right, subsequent encounter improving but still has some pain, and with sensation change as well to 4th nad 5th fingers.  Suspect this is due to contusion and swelling of the nerve.  We hope this will resolve soon.  For the time being continue splint while at work and light duty Recheck in one week- Sooner if worse.

## 2015-08-15 NOTE — Patient Instructions (Signed)
I think you may have a little swelling around the nerve in your hand causing the dull sensation in your 4th and 5th fingers.  At this point you can take the splint off when you are not at work and do some range of motion and fine motor exercises. Continue to follow light duty at work Let 's recheck in 1 week- sooner if anything changes or gets worse

## 2015-08-17 ENCOUNTER — Encounter: Payer: Self-pay | Admitting: General Practice

## 2015-08-22 ENCOUNTER — Ambulatory Visit (INDEPENDENT_AMBULATORY_CARE_PROVIDER_SITE_OTHER): Payer: Worker's Compensation | Admitting: Family Medicine

## 2015-08-22 VITALS — BP 110/80 | HR 88 | Temp 98.5°F | Resp 18 | Ht 67.0 in | Wt 251.2 lb

## 2015-08-22 DIAGNOSIS — S60221D Contusion of right hand, subsequent encounter: Secondary | ICD-10-CM | POA: Diagnosis not present

## 2015-08-22 DIAGNOSIS — S6721XD Crushing injury of right hand, subsequent encounter: Secondary | ICD-10-CM

## 2015-08-22 NOTE — Progress Notes (Signed)
Patient ID: Theresa Castillo, female    DOB: [birth year]  Age: 49 y.o. MRN: [id number]  Chief Complaint  Patient presents with  . Follow-up    W/C right hand    Subjective:   Patient is here for recheck of her hand pain. She continues to hurt in the back of the right hand without the shooting pains and numbness have subsided it is still a hot sensation. She hurts along up toward her elbow. Is able to do things adequately with it. She is not sure whether this will cause problems with her in the workplace. 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 She is not sure exactly how heavy her items she needs to lift.  Current allergies, medications, problem list, past/family and social histories reviewed.  Objective:  BP 110/80 mmHg  Pulse 88  Temp(Src) 98.5 F (36.9 C) (Oral)  Resp 18  Ht 5\' 7"  (1.702 m)  Wt 251 lb 3.2 oz (113.944 kg)  BMI 39.33 kg/m2  SpO2 97%  Full range of motion of her hand. Strength good. A little tenderness on the dorsal hand at the fourth metacarpal area.  Assessment & Plan:   Assessment: No diagnosis found.    Plan: I think she is ready to try and go back to work. Should not lift over about 15 pounds. She'll return in 1 week. If she cannot do that much work. She needs to call back and we would have to leave her off until next week.       Patient Instructions  We will give you a work note for 15 pounds maximum lifting. You need to still try and minimize activities that strain your hand excessively, and if you are having too much discomfort he need to return to redefine what we can allow you to do. Plan to return in 1 week, sooner if any job concerns.     Return in about 1 week (around 08/29/2015).   HOPPER,DAVID, MD 08/22/2015

## 2015-08-22 NOTE — Patient Instructions (Signed)
We will give you a work note for 15 pounds maximum lifting. You need to still try and minimize activities that strain your hand excessively, and if you are having too much discomfort he need to return to redefine what we can allow you to do. Plan to return in 1 week, sooner if any job concerns.

## 2015-10-22 IMAGING — CR DG CHEST 1V PORT
2 series · 2 of 2 positions shown · non-contrast
Comparison: One view chest x-ray 06/26/2013.

CLINICAL DATA: Drug overdose.  Intubation.

EXAM:
PORTABLE CHEST - 1 VIEW

[AP (1 of 2)]
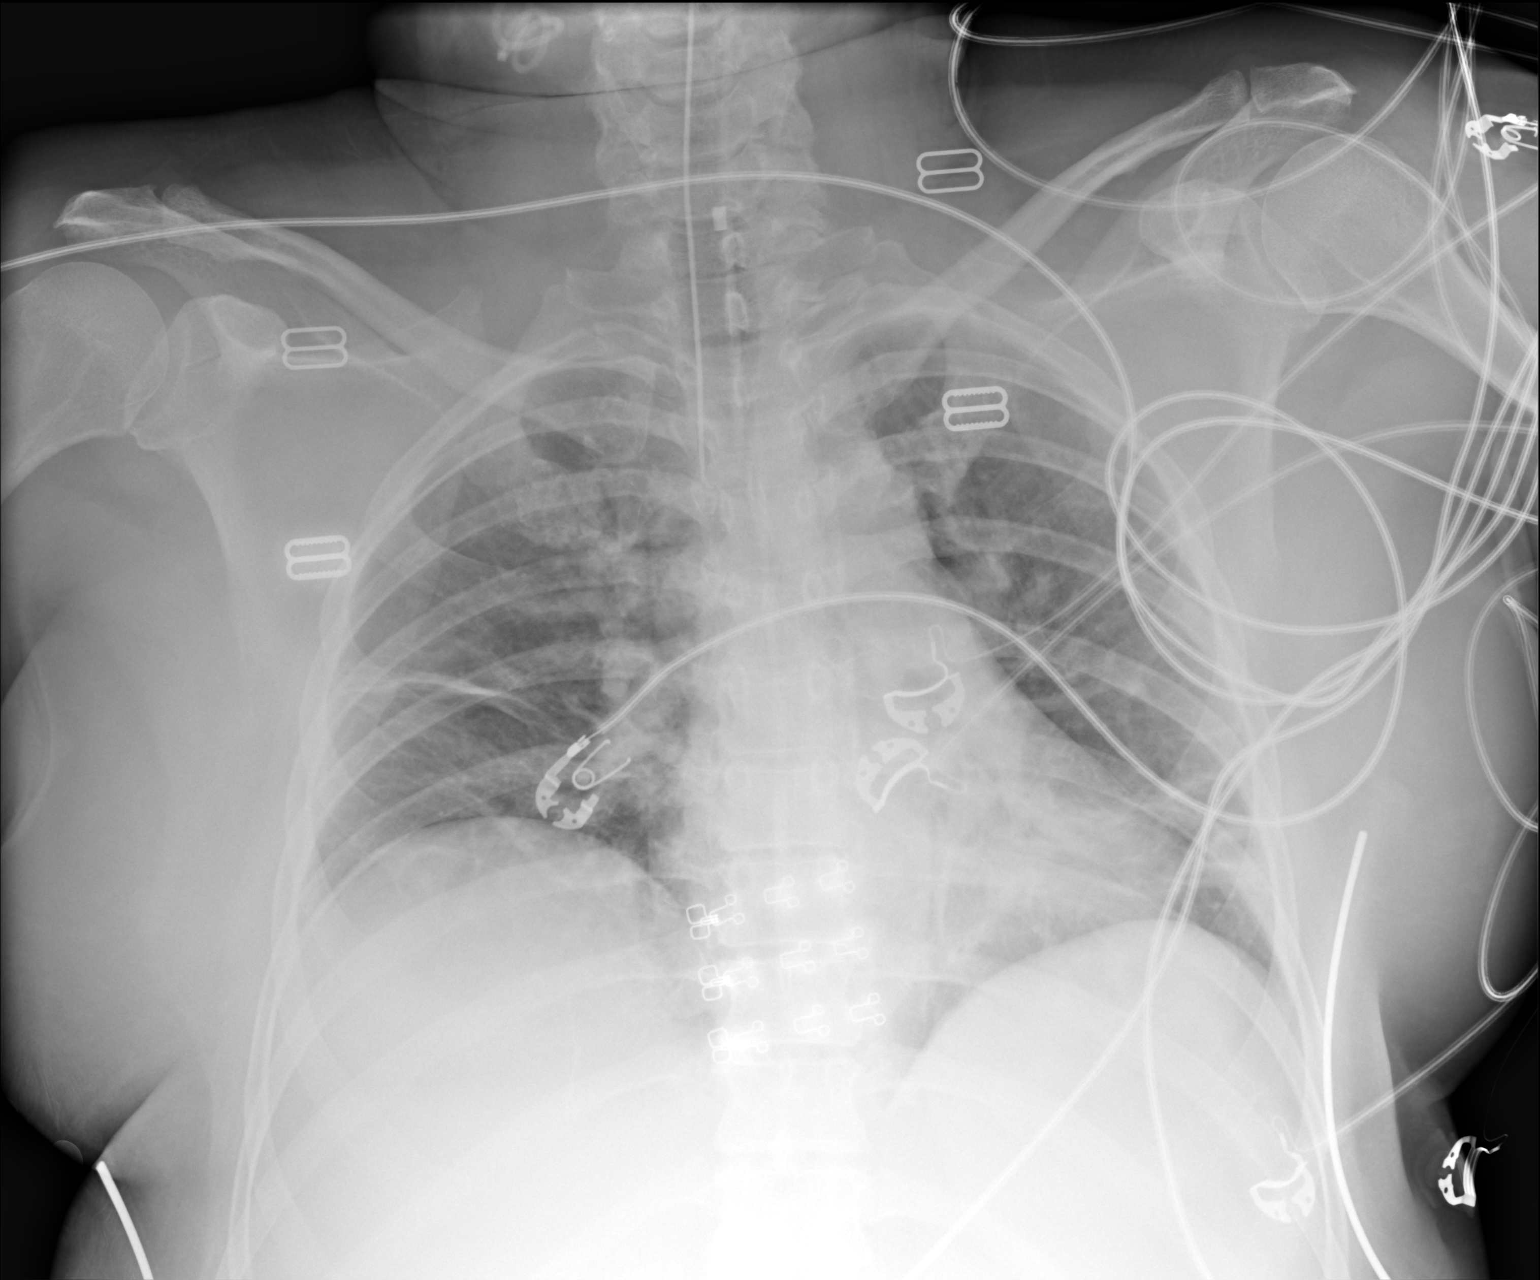

[AP (2 of 2)]
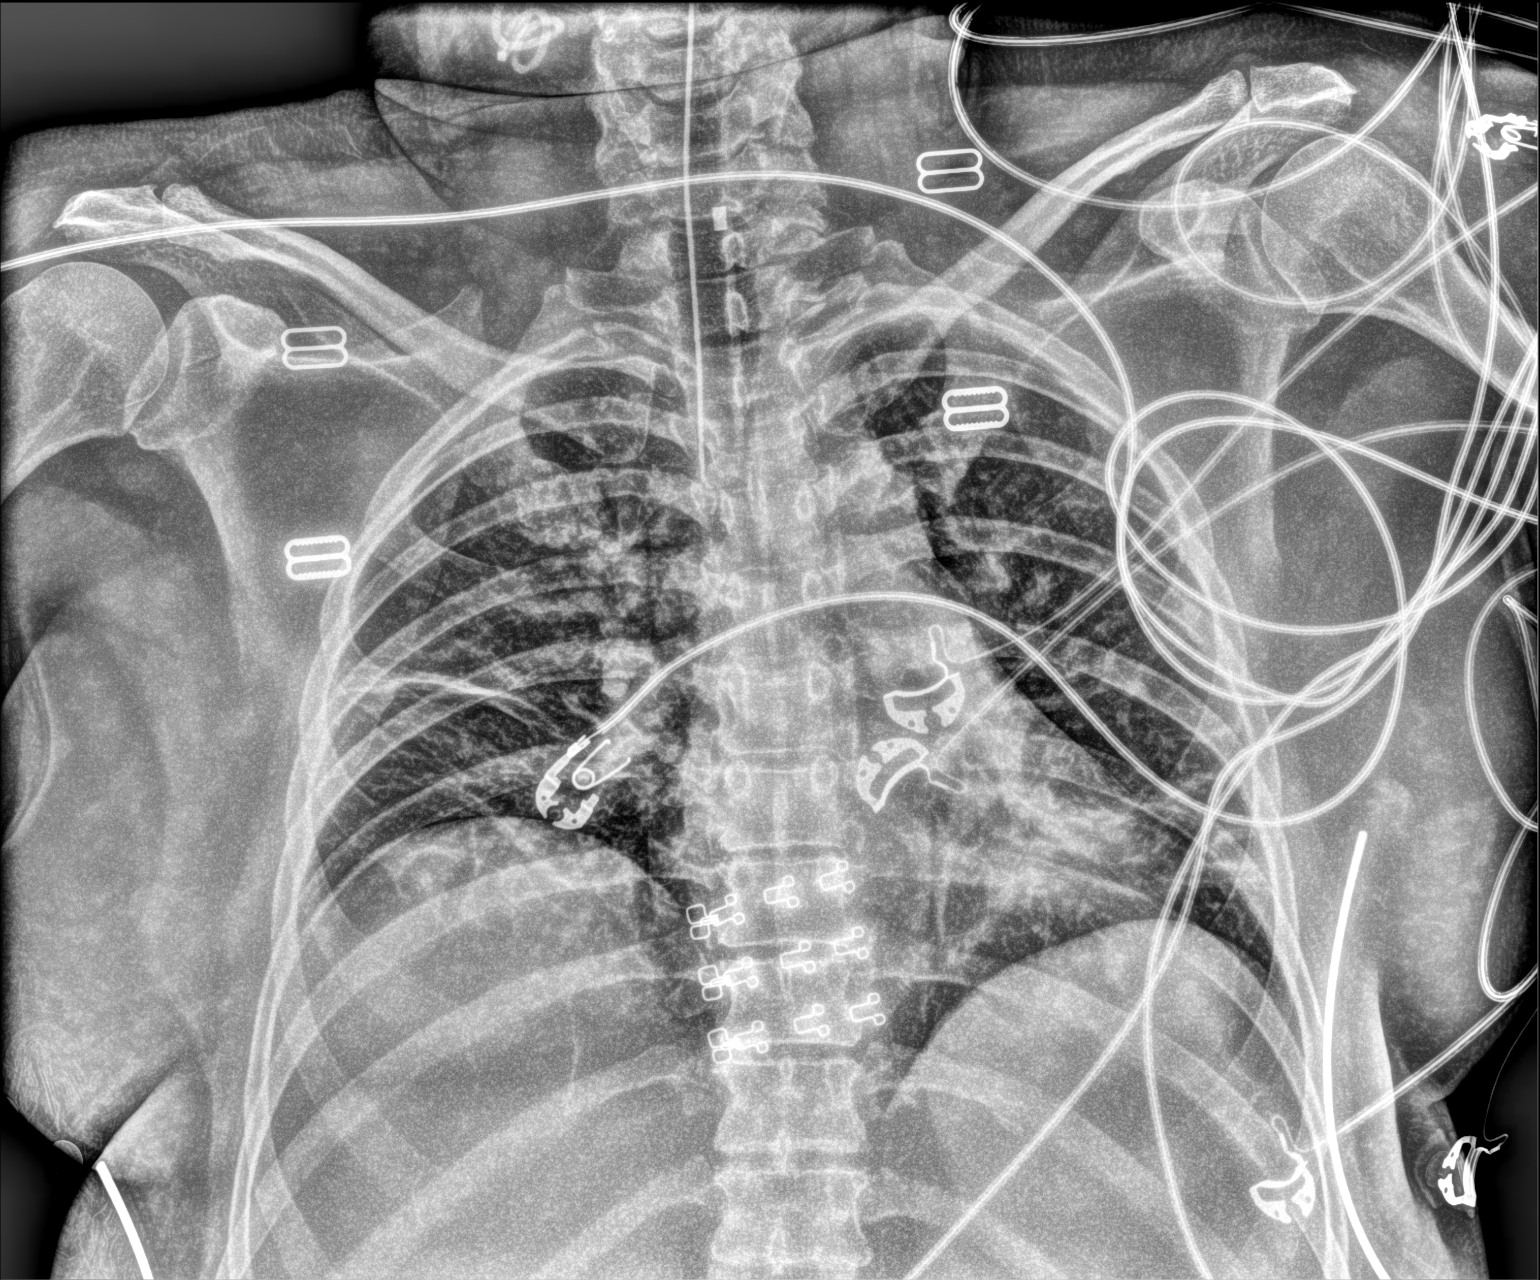

[2 of 2 positions shown; findings below may reference images not displayed]

FINDINGS: Endotracheal tube tip in satisfactory position projecting
approximately 3 cm above the carina. Linear atelectasis in the lung
bases bilaterally. Patchy airspace opacity suspected in the right
upper lobe. Lungs otherwise clear. No visible pleural effusions.
Cardiomediastinal silhouette unremarkable and unchanged.
IMPRESSION: 1. Endotracheal tube tip in satisfactory position projecting
approximately 3 cm above the carina.
2. Atelectasis in both lung bases.
3. Possible developing pneumonia in the right upper lobe, query
aspiration.

## 2015-12-06 ENCOUNTER — Encounter: Payer: Self-pay | Admitting: General Practice

## 2017-02-07 ENCOUNTER — Encounter: Payer: Self-pay | Admitting: General Practice

## 2017-05-16 ENCOUNTER — Other Ambulatory Visit: Payer: Self-pay | Admitting: Family Medicine

## 2017-05-16 DIAGNOSIS — Z1231 Encounter for screening mammogram for malignant neoplasm of breast: Secondary | ICD-10-CM

## 2017-05-28 ENCOUNTER — Ambulatory Visit: Payer: Self-pay

## 2017-06-05 ENCOUNTER — Ambulatory Visit
Admission: RE | Admit: 2017-06-05 | Discharge: 2017-06-05 | Disposition: A | Discharge status: Home / Self Care | Payer: BLUE CROSS/BLUE SHIELD | Source: Ambulatory Visit | Attending: Family Medicine | Admitting: Family Medicine

## 2017-06-05 DIAGNOSIS — Z1231 Encounter for screening mammogram for malignant neoplasm of breast: Secondary | ICD-10-CM
